# Patient Record
Sex: Female | Born: 1935 | Race: White | Hispanic: No
Health system: Southern US, Community
[De-identification: ages and names within clinical notes are randomized; demographics above are authoritative.]

---

## 2004-09-22 ENCOUNTER — Ambulatory Visit: Payer: Self-pay | Admitting: General Practice

## 2005-09-07 ENCOUNTER — Ambulatory Visit: Payer: Self-pay | Admitting: Internal Medicine

## 2006-01-16 ENCOUNTER — Ambulatory Visit: Payer: Self-pay | Admitting: Internal Medicine

## 2006-02-17 ENCOUNTER — Ambulatory Visit: Payer: Self-pay | Admitting: Internal Medicine

## 2006-04-06 ENCOUNTER — Observation Stay: Payer: Self-pay | Admitting: Internal Medicine

## 2006-05-01 ENCOUNTER — Ambulatory Visit: Payer: Self-pay | Admitting: Unknown Physician Specialty

## 2006-07-13 ENCOUNTER — Ambulatory Visit: Payer: Self-pay | Admitting: Unknown Physician Specialty

## 2006-07-31 ENCOUNTER — Observation Stay: Payer: Self-pay | Admitting: Internal Medicine

## 2006-12-04 ENCOUNTER — Ambulatory Visit: Payer: Self-pay | Admitting: Unknown Physician Specialty

## 2006-12-05 ENCOUNTER — Inpatient Hospital Stay: Payer: Self-pay | Admitting: Internal Medicine

## 2008-03-04 ENCOUNTER — Observation Stay: Payer: Self-pay | Admitting: Internal Medicine

## 2008-05-13 ENCOUNTER — Ambulatory Visit: Payer: Self-pay | Admitting: Internal Medicine

## 2008-11-24 ENCOUNTER — Ambulatory Visit: Payer: Self-pay | Admitting: Otolaryngology

## 2009-02-17 ENCOUNTER — Emergency Department: Payer: Self-pay | Admitting: Emergency Medicine

## 2009-07-07 ENCOUNTER — Inpatient Hospital Stay: Payer: Self-pay | Admitting: Internal Medicine

## 2009-09-23 ENCOUNTER — Observation Stay: Payer: Self-pay | Admitting: Internal Medicine

## 2009-10-29 ENCOUNTER — Observation Stay: Payer: Self-pay | Admitting: Internal Medicine

## 2009-11-19 ENCOUNTER — Ambulatory Visit: Payer: Self-pay | Admitting: Internal Medicine

## 2009-11-20 ENCOUNTER — Ambulatory Visit: Payer: Self-pay | Admitting: Internal Medicine

## 2009-12-10 ENCOUNTER — Ambulatory Visit: Payer: Self-pay | Admitting: Internal Medicine

## 2010-01-10 ENCOUNTER — Ambulatory Visit: Payer: Self-pay | Admitting: Internal Medicine

## 2010-01-25 ENCOUNTER — Inpatient Hospital Stay: Payer: Self-pay | Admitting: Family Medicine

## 2010-02-10 ENCOUNTER — Ambulatory Visit: Payer: Self-pay | Admitting: Internal Medicine

## 2010-02-16 ENCOUNTER — Observation Stay: Payer: Self-pay | Admitting: Internal Medicine

## 2010-03-24 ENCOUNTER — Observation Stay: Payer: Self-pay | Admitting: Internal Medicine

## 2010-05-06 ENCOUNTER — Ambulatory Visit: Payer: Self-pay | Admitting: Otolaryngology

## 2010-05-07 ENCOUNTER — Ambulatory Visit: Payer: Self-pay | Admitting: Otolaryngology

## 2010-05-11 ENCOUNTER — Observation Stay: Payer: Self-pay | Admitting: Internal Medicine

## 2010-05-18 ENCOUNTER — Inpatient Hospital Stay: Payer: Self-pay | Admitting: Unknown Physician Specialty

## 2010-05-21 ENCOUNTER — Encounter: Payer: Self-pay | Admitting: Internal Medicine

## 2010-06-11 ENCOUNTER — Encounter: Payer: Self-pay | Admitting: Internal Medicine

## 2010-07-11 ENCOUNTER — Encounter: Payer: Self-pay | Admitting: Internal Medicine

## 2010-08-11 ENCOUNTER — Encounter: Payer: Self-pay | Admitting: Internal Medicine

## 2011-01-20 ENCOUNTER — Ambulatory Visit: Payer: Self-pay | Admitting: Internal Medicine

## 2011-01-20 LAB — CANCER CENTER HEMOGLOBIN: HGB: 7.9 g/dL — ABNORMAL LOW (ref 12.0–16.0)

## 2011-02-03 LAB — CBC CANCER CENTER
Basophil %: 0.4 %
Eosinophil %: 0.2 %
HCT: 29.4 % — ABNORMAL LOW (ref 35.0–47.0)
HGB: 9.7 g/dL — ABNORMAL LOW (ref 12.0–16.0)
Lymphocyte #: 0.7 x10 3/mm — ABNORMAL LOW (ref 1.0–3.6)
MCV: 94 fL (ref 80–100)
Monocyte %: 12.1 %
Neutrophil #: 3.7 x10 3/mm (ref 1.4–6.5)
Platelet: 116 x10 3/mm — ABNORMAL LOW (ref 150–440)
RBC: 3.14 10*6/uL — ABNORMAL LOW (ref 3.80–5.20)
RDW: 17.1 % — ABNORMAL HIGH (ref 11.5–14.5)
WBC: 5 x10 3/mm (ref 3.6–11.0)

## 2011-02-03 LAB — URINALYSIS, COMPLETE
Ketone: NEGATIVE
Leukocyte Esterase: NEGATIVE
Nitrite: NEGATIVE
Ph: 7 (ref 4.5–8.0)
Protein: NEGATIVE
Specific Gravity: 1.008 (ref 1.003–1.030)

## 2011-02-04 ENCOUNTER — Inpatient Hospital Stay: Payer: Self-pay | Admitting: Internal Medicine

## 2011-02-04 LAB — CBC WITH DIFFERENTIAL/PLATELET
Basophil #: 0.1 10*3/uL (ref 0.0–0.1)
Lymphocyte #: 0.7 10*3/uL — ABNORMAL LOW (ref 1.0–3.6)
MCH: 30.9 pg (ref 26.0–34.0)
MCV: 95 fL (ref 80–100)
Monocyte #: 0.5 10*3/uL (ref 0.0–0.7)
Platelet: 135 10*3/uL — ABNORMAL LOW (ref 150–440)
RBC: 3.24 10*6/uL — ABNORMAL LOW (ref 3.80–5.20)
RDW: 17.6 % — ABNORMAL HIGH (ref 11.5–14.5)
WBC: 5.8 10*3/uL (ref 3.6–11.0)

## 2011-02-04 LAB — COMPREHENSIVE METABOLIC PANEL
Albumin: 3.8 g/dL (ref 3.4–5.0)
Anion Gap: 11 (ref 7–16)
Calcium, Total: 9.3 mg/dL (ref 8.5–10.1)
Co2: 29 mmol/L (ref 21–32)
EGFR (Non-African Amer.): >60
Glucose: 90 mg/dL (ref 65–99)
Osmolality: 287 (ref 275–301)
Potassium: 4.2 mmol/L (ref 3.5–5.1)
SGOT(AST): 27 U/L (ref 15–37)
Sodium: 140 mmol/L (ref 136–145)
Total Protein: 6.8 g/dL (ref 6.4–8.2)

## 2011-02-04 LAB — APTT: Activated PTT: 34.6 secs (ref 23.6–35.9)

## 2011-02-05 LAB — BASIC METABOLIC PANEL
Creatinine: 0.98 mg/dL (ref 0.60–1.30)
EGFR (African American): >60
EGFR (Non-African Amer.): 57 — ABNORMAL LOW
Glucose: 98 mg/dL (ref 65–99)
Potassium: 3.8 mmol/L (ref 3.5–5.1)
Sodium: 138 mmol/L (ref 136–145)

## 2011-02-05 LAB — HEMOGLOBIN: HGB: 8.9 g/dL — ABNORMAL LOW (ref 12.0–16.0)

## 2011-02-06 LAB — HEMOGLOBIN: HGB: 9.1 g/dL — ABNORMAL LOW (ref 12.0–16.0)

## 2011-02-06 LAB — BASIC METABOLIC PANEL
Chloride: 101 mmol/L (ref 98–107)
Co2: 28 mmol/L (ref 21–32)
EGFR (African American): >60
EGFR (Non-African Amer.): >60
Glucose: 106 mg/dL — ABNORMAL HIGH (ref 65–99)
Potassium: 4 mmol/L (ref 3.5–5.1)
Sodium: 140 mmol/L (ref 136–145)

## 2011-02-08 LAB — BASIC METABOLIC PANEL
BUN: 21 mg/dL — ABNORMAL HIGH (ref 7–18)
Calcium, Total: 8.8 mg/dL (ref 8.5–10.1)
Chloride: 103 mmol/L (ref 98–107)
Co2: 28 mmol/L (ref 21–32)
EGFR (Non-African Amer.): 52 — ABNORMAL LOW
Glucose: 111 mg/dL — ABNORMAL HIGH (ref 65–99)
Osmolality: 287 (ref 275–301)
Potassium: 3.9 mmol/L (ref 3.5–5.1)

## 2011-02-11 ENCOUNTER — Ambulatory Visit: Payer: Self-pay | Admitting: Internal Medicine

## 2011-02-17 LAB — CBC CANCER CENTER
Basophil #: 0 x10 3/mm (ref 0.0–0.1)
Eosinophil %: 2.7 %
Lymphocyte #: 1 x10 3/mm (ref 1.0–3.6)
MCH: 31.3 pg (ref 26.0–34.0)
MCHC: 33.8 g/dL (ref 32.0–36.0)
MCV: 93 fL (ref 80–100)
Monocyte #: 0.7 x10 3/mm (ref 0.0–0.7)
Monocyte %: 10.7 %
Neutrophil #: 5 x10 3/mm (ref 1.4–6.5)
Neutrophil %: 71.8 %
Platelet: 217 x10 3/mm (ref 150–440)
RBC: 3.26 10*6/uL — ABNORMAL LOW (ref 3.80–5.20)

## 2011-02-17 LAB — IRON AND TIBC
Iron Bind.Cap.(Total): 451 ug/dL — ABNORMAL HIGH (ref 250–450)
Iron Saturation: 10 %
Iron: 43 ug/dL — ABNORMAL LOW (ref 50–170)

## 2011-02-17 LAB — FERRITIN: Ferritin (ARMC): 92 ng/mL (ref 8–388)

## 2011-02-21 ENCOUNTER — Observation Stay: Payer: Self-pay | Admitting: Internal Medicine

## 2011-02-21 LAB — COMPREHENSIVE METABOLIC PANEL
Albumin: 3.7 g/dL (ref 3.4–5.0)
Alkaline Phosphatase: 139 U/L — ABNORMAL HIGH (ref 50–136)
Anion Gap: 11 (ref 7–16)
BUN: 41 mg/dL — ABNORMAL HIGH (ref 7–18)
Bilirubin,Total: 0.6 mg/dL (ref 0.2–1.0)
Calcium, Total: 9.3 mg/dL (ref 8.5–10.1)
Chloride: 89 mmol/L — ABNORMAL LOW (ref 98–107)
Co2: 33 mmol/L — ABNORMAL HIGH (ref 21–32)
Creatinine: 0.88 mg/dL (ref 0.60–1.30)
EGFR (African American): >60
Glucose: 95 mg/dL (ref 65–99)
Osmolality: 276 (ref 275–301)
Potassium: 2.5 mmol/L — CL (ref 3.5–5.1)
Sodium: 133 mmol/L — ABNORMAL LOW (ref 136–145)
Total Protein: 7.7 g/dL (ref 6.4–8.2)

## 2011-02-21 LAB — CBC
MCH: 31 pg (ref 26.0–34.0)
MCHC: 33.7 g/dL (ref 32.0–36.0)
MCV: 92 fL (ref 80–100)
RBC: 3.44 10*6/uL — ABNORMAL LOW (ref 3.80–5.20)
RDW: 17 % — ABNORMAL HIGH (ref 11.5–14.5)
WBC: 6.4 10*3/uL (ref 3.6–11.0)

## 2011-02-21 LAB — HEMOGLOBIN: HGB: 10.2 g/dL — ABNORMAL LOW (ref 12.0–16.0)

## 2011-02-21 LAB — PROTIME-INR
INR: 0.9
Prothrombin Time: 12.9 secs (ref 11.5–14.7)

## 2011-02-21 LAB — CK TOTAL AND CKMB (NOT AT ARMC): CK-MB: 1.5 ng/mL (ref 0.5–3.6)

## 2011-02-21 LAB — TROPONIN I: Troponin-I: 0.1 ng/mL — ABNORMAL HIGH

## 2011-02-22 LAB — CBC WITH DIFFERENTIAL/PLATELET
Basophil #: 0 10*3/uL (ref 0.0–0.1)
Basophil %: 0.4 %
Eosinophil #: 0.3 10*3/uL (ref 0.0–0.7)
HCT: 28.4 % — ABNORMAL LOW (ref 35.0–47.0)
Lymphocyte #: 1.1 10*3/uL (ref 1.0–3.6)
MCH: 30.8 pg (ref 26.0–34.0)
MCHC: 33.5 g/dL (ref 32.0–36.0)
MCV: 92 fL (ref 80–100)
Monocyte #: 0.7 10*3/uL (ref 0.0–0.7)
Monocyte %: 10.4 %
Neutrophil #: 4.4 10*3/uL (ref 1.4–6.5)
RBC: 3.09 10*6/uL — ABNORMAL LOW (ref 3.80–5.20)
RDW: 16.4 % — ABNORMAL HIGH (ref 11.5–14.5)
WBC: 6.5 10*3/uL (ref 3.6–11.0)

## 2011-02-22 LAB — BASIC METABOLIC PANEL
Anion Gap: 7 (ref 7–16)
BUN: 22 mg/dL — ABNORMAL HIGH (ref 7–18)
Calcium, Total: 8.4 mg/dL — ABNORMAL LOW (ref 8.5–10.1)
Chloride: 98 mmol/L (ref 98–107)
Co2: 29 mmol/L (ref 21–32)
Creatinine: 0.86 mg/dL (ref 0.60–1.30)
EGFR (Non-African Amer.): >60
Glucose: 93 mg/dL (ref 65–99)
Potassium: 5.3 mmol/L — ABNORMAL HIGH (ref 3.5–5.1)
Sodium: 134 mmol/L — ABNORMAL LOW (ref 136–145)

## 2011-02-25 ENCOUNTER — Ambulatory Visit: Payer: Self-pay | Admitting: Internal Medicine

## 2011-03-04 ENCOUNTER — Ambulatory Visit: Payer: Self-pay | Admitting: Unknown Physician Specialty

## 2011-03-08 ENCOUNTER — Inpatient Hospital Stay: Payer: Self-pay | Admitting: Unknown Physician Specialty

## 2011-03-08 DIAGNOSIS — I4891 Unspecified atrial fibrillation: Secondary | ICD-10-CM

## 2011-03-08 LAB — BASIC METABOLIC PANEL
Anion Gap: 11 (ref 7–16)
BUN: 19 mg/dL — ABNORMAL HIGH (ref 7–18)
Chloride: 106 mmol/L (ref 98–107)
EGFR (African American): >60
EGFR (Non-African Amer.): 55 — ABNORMAL LOW
Osmolality: 296 (ref 275–301)

## 2011-03-08 LAB — URINALYSIS, COMPLETE
Bilirubin,UR: NEGATIVE
Glucose,UR: NEGATIVE mg/dL (ref 0–75)
Ketone: NEGATIVE
Leukocyte Esterase: NEGATIVE
Nitrite: NEGATIVE
Ph: 5 (ref 4.5–8.0)
RBC,UR: NONE SEEN /HPF (ref 0–5)
Squamous Epithelial: 1
WBC UR: 1 /HPF (ref 0–5)

## 2011-03-08 LAB — CBC
HCT: 23.5 % — ABNORMAL LOW (ref 35.0–47.0)
HGB: 7.6 g/dL — ABNORMAL LOW (ref 12.0–16.0)
MCHC: 32.3 g/dL (ref 32.0–36.0)
MCV: 95 fL (ref 80–100)
RBC: 2.47 10*6/uL — ABNORMAL LOW (ref 3.80–5.20)
RDW: 18 % — ABNORMAL HIGH (ref 11.5–14.5)
WBC: 4.6 10*3/uL (ref 3.6–11.0)

## 2011-03-09 LAB — BASIC METABOLIC PANEL
Anion Gap: 12 (ref 7–16)
BUN: 19 mg/dL — ABNORMAL HIGH (ref 7–18)
Co2: 24 mmol/L (ref 21–32)
Creatinine: 1.07 mg/dL (ref 0.60–1.30)
EGFR (Non-African Amer.): 51 — ABNORMAL LOW
Glucose: 122 mg/dL — ABNORMAL HIGH (ref 65–99)
Osmolality: 283 (ref 275–301)
Sodium: 140 mmol/L (ref 136–145)

## 2011-03-10 LAB — CBC WITH DIFFERENTIAL/PLATELET
Basophil %: 0.8 %
Eosinophil #: 0.4 10*3/uL (ref 0.0–0.7)
Eosinophil %: 7.9 %
HGB: 9.2 g/dL — ABNORMAL LOW (ref 12.0–16.0)
Lymphocyte #: 1 10*3/uL (ref 1.0–3.6)
MCH: 31.1 pg (ref 26.0–34.0)
MCHC: 32.7 g/dL (ref 32.0–36.0)
MCV: 95 fL (ref 80–100)
Monocyte #: 0.6 10*3/uL (ref 0.0–0.7)
Neutrophil #: 2.6 10*3/uL (ref 1.4–6.5)
Platelet: 140 10*3/uL — ABNORMAL LOW (ref 150–440)
RBC: 2.97 10*6/uL — ABNORMAL LOW (ref 3.80–5.20)

## 2011-03-10 LAB — BASIC METABOLIC PANEL
Co2: 24 mmol/L (ref 21–32)
Creatinine: 0.99 mg/dL (ref 0.60–1.30)
EGFR (African American): >60
EGFR (Non-African Amer.): 56 — ABNORMAL LOW
Glucose: 90 mg/dL (ref 65–99)
Potassium: 4.5 mmol/L (ref 3.5–5.1)
Sodium: 140 mmol/L (ref 136–145)

## 2011-03-10 LAB — MAGNESIUM: Magnesium: 2 mg/dL

## 2011-03-11 ENCOUNTER — Ambulatory Visit: Payer: Self-pay | Admitting: Internal Medicine

## 2011-03-11 LAB — BASIC METABOLIC PANEL
Anion Gap: 7 (ref 7–16)
BUN: 15 mg/dL (ref 7–18)
Calcium, Total: 8.5 mg/dL (ref 8.5–10.1)
Co2: 28 mmol/L (ref 21–32)
EGFR (African American): >60
EGFR (Non-African Amer.): 59 — ABNORMAL LOW
Glucose: 110 mg/dL — ABNORMAL HIGH (ref 65–99)
Osmolality: 283 (ref 275–301)
Sodium: 141 mmol/L (ref 136–145)

## 2011-03-11 LAB — URINALYSIS, COMPLETE
Bilirubin,UR: NEGATIVE
Ketone: NEGATIVE
Leukocyte Esterase: NEGATIVE
Nitrite: NEGATIVE
Ph: 5 (ref 4.5–8.0)
Protein: NEGATIVE
RBC,UR: <1 /HPF (ref 0–5)
Squamous Epithelial: <1
WBC UR: 1 /HPF (ref 0–5)

## 2011-03-14 LAB — PATHOLOGY REPORT

## 2011-05-09 ENCOUNTER — Inpatient Hospital Stay: Payer: Self-pay | Admitting: Internal Medicine

## 2011-05-09 LAB — CBC WITH DIFFERENTIAL/PLATELET
Basophil %: 1.2 %
HCT: 22.9 % — ABNORMAL LOW (ref 35.0–47.0)
Lymphocyte #: 1.3 10*3/uL (ref 1.0–3.6)
MCV: 99 fL (ref 80–100)
Monocyte #: 0.5 x10 3/mm (ref 0.2–0.9)
Monocyte %: 10.8 %
Neutrophil %: 53.8 %
Platelet: 134 10*3/uL — ABNORMAL LOW (ref 150–440)
RBC: 2.32 10*6/uL — ABNORMAL LOW (ref 3.80–5.20)
RDW: 15.2 % — ABNORMAL HIGH (ref 11.5–14.5)

## 2011-05-09 LAB — COMPREHENSIVE METABOLIC PANEL
Alkaline Phosphatase: 134 U/L (ref 50–136)
Anion Gap: 10 (ref 7–16)
Bilirubin,Total: 0.4 mg/dL (ref 0.2–1.0)
Co2: 24 mmol/L (ref 21–32)
EGFR (Non-African Amer.): 49 — ABNORMAL LOW
Potassium: 4.3 mmol/L (ref 3.5–5.1)
SGPT (ALT): 16 U/L
Sodium: 138 mmol/L (ref 136–145)

## 2011-05-10 LAB — URINALYSIS, COMPLETE
Bacteria: NONE SEEN
Leukocyte Esterase: NEGATIVE
Nitrite: NEGATIVE
Ph: 6 (ref 4.5–8.0)
Protein: NEGATIVE
RBC,UR: <1 /HPF (ref 0–5)
Specific Gravity: 1.009 (ref 1.003–1.030)
Squamous Epithelial: <1
WBC UR: <1 /HPF (ref 0–5)

## 2011-05-10 LAB — CBC WITH DIFFERENTIAL/PLATELET
Basophil #: 0.1 10*3/uL (ref 0.0–0.1)
Basophil %: 2.1 %
HGB: 11.1 g/dL — ABNORMAL LOW (ref 12.0–16.0)
Lymphocyte %: 31.2 %
MCH: 31.9 pg (ref 26.0–34.0)
MCHC: 32.9 g/dL (ref 32.0–36.0)
MCV: 97 fL (ref 80–100)
Monocyte %: 11.7 %
Neutrophil %: 52.3 %

## 2011-05-11 LAB — BASIC METABOLIC PANEL
Calcium, Total: 8.2 mg/dL — ABNORMAL LOW (ref 8.5–10.1)
Chloride: 109 mmol/L — ABNORMAL HIGH (ref 98–107)
Co2: 25 mmol/L (ref 21–32)
Glucose: 77 mg/dL (ref 65–99)
Osmolality: 282 (ref 275–301)
Potassium: 3.7 mmol/L (ref 3.5–5.1)
Sodium: 141 mmol/L (ref 136–145)

## 2011-05-11 LAB — CBC WITH DIFFERENTIAL/PLATELET
Basophil #: 0 10*3/uL (ref 0.0–0.1)
Basophil %: 1.3 %
Eosinophil #: 0.1 10*3/uL (ref 0.0–0.7)
Eosinophil %: 3.1 %
HCT: 26.7 % — ABNORMAL LOW (ref 35.0–47.0)
HGB: 8.8 g/dL — ABNORMAL LOW (ref 12.0–16.0)
Lymphocyte %: 32 %
MCV: 95 fL (ref 80–100)
Monocyte #: 0.5 x10 3/mm (ref 0.2–0.9)
Monocyte %: 13.8 %
Neutrophil #: 1.8 10*3/uL (ref 1.4–6.5)
RBC: 2.81 10*6/uL — ABNORMAL LOW (ref 3.80–5.20)

## 2011-05-12 LAB — CBC WITH DIFFERENTIAL/PLATELET
Basophil %: 1.1 %
Eosinophil #: 0.1 10*3/uL (ref 0.0–0.7)
Eosinophil %: 2.7 %
HCT: 26.7 % — ABNORMAL LOW (ref 35.0–47.0)
Lymphocyte %: 25.8 %
MCHC: 33.8 g/dL (ref 32.0–36.0)
MCV: 95 fL (ref 80–100)
Monocyte #: 0.5 x10 3/mm (ref 0.2–0.9)
Monocyte %: 13.9 %
Neutrophil #: 2 10*3/uL (ref 1.4–6.5)
Neutrophil %: 56.5 %
Platelet: 120 10*3/uL — ABNORMAL LOW (ref 150–440)
RBC: 2.82 10*6/uL — ABNORMAL LOW (ref 3.80–5.20)
RDW: 15.5 % — ABNORMAL HIGH (ref 11.5–14.5)
WBC: 3.5 10*3/uL — ABNORMAL LOW (ref 3.6–11.0)

## 2011-05-12 LAB — BASIC METABOLIC PANEL
BUN: 11 mg/dL (ref 7–18)
Calcium, Total: 8.3 mg/dL — ABNORMAL LOW (ref 8.5–10.1)
Chloride: 107 mmol/L (ref 98–107)
Creatinine: 0.78 mg/dL (ref 0.60–1.30)
EGFR (African American): >60
EGFR (Non-African Amer.): >60
Glucose: 71 mg/dL (ref 65–99)
Potassium: 3.1 mmol/L — ABNORMAL LOW (ref 3.5–5.1)
Sodium: 141 mmol/L (ref 136–145)

## 2011-05-13 LAB — BASIC METABOLIC PANEL
Anion Gap: 6 — ABNORMAL LOW (ref 7–16)
BUN: 12 mg/dL (ref 7–18)
Chloride: 107 mmol/L (ref 98–107)
Co2: 27 mmol/L (ref 21–32)
Creatinine: 0.9 mg/dL (ref 0.60–1.30)
EGFR (African American): >60
Potassium: 4.2 mmol/L (ref 3.5–5.1)
Sodium: 140 mmol/L (ref 136–145)

## 2011-05-13 LAB — HEMOGLOBIN: HGB: 8.8 g/dL — ABNORMAL LOW (ref 12.0–16.0)

## 2011-07-15 ENCOUNTER — Ambulatory Visit: Payer: Self-pay | Admitting: Internal Medicine

## 2011-07-15 LAB — CBC CANCER CENTER
HCT: 21.1 % — ABNORMAL LOW (ref 35.0–47.0)
Lymphocytes: 15 %
MCHC: 30.9 g/dL — ABNORMAL LOW (ref 32.0–36.0)
Monocytes: 11 %
Platelet: 155 x10 3/mm (ref 150–440)
RDW: 15.1 % — ABNORMAL HIGH (ref 11.5–14.5)
Segmented Neutrophils: 72 %
WBC: 4.5 x10 3/mm (ref 3.6–11.0)

## 2011-07-15 LAB — FOLATE: Folic Acid: 28.8 ng/mL (ref 3.1–100.0)

## 2011-07-15 LAB — LACTATE DEHYDROGENASE: LDH: 196 U/L (ref 84–246)

## 2011-07-15 LAB — CANCER CENTER HEMOGLOBIN: HGB: 7.7 g/dL — ABNORMAL LOW (ref 12.0–16.0)

## 2011-07-15 LAB — FERRITIN: Ferritin (ARMC): 24 ng/mL (ref 8–388)

## 2011-07-18 LAB — PROT IMMUNOELECTROPHORES(ARMC)

## 2011-07-20 LAB — CANCER CENTER HEMOGLOBIN: HGB: 7.4 g/dL — ABNORMAL LOW (ref 12.0–16.0)

## 2011-08-03 LAB — CANCER CENTER HEMOGLOBIN: HGB: 8.8 g/dL — ABNORMAL LOW (ref 12.0–16.0)

## 2011-08-11 ENCOUNTER — Ambulatory Visit: Payer: Self-pay | Admitting: Internal Medicine

## 2011-08-17 LAB — CANCER CENTER HEMOGLOBIN: HGB: 6.2 g/dL — ABNORMAL LOW (ref 12.0–16.0)

## 2011-08-28 ENCOUNTER — Emergency Department: Payer: Self-pay | Admitting: Internal Medicine

## 2011-09-07 LAB — CBC CANCER CENTER
Basophil #: 0 x10 3/mm (ref 0.0–0.1)
Basophil %: 0.9 %
Eosinophil #: 0.1 x10 3/mm (ref 0.0–0.7)
HCT: 19 % — ABNORMAL LOW (ref 35.0–47.0)
Lymphocyte %: 15 %
MCH: 27.5 pg (ref 26.0–34.0)
MCHC: 31.2 g/dL — ABNORMAL LOW (ref 32.0–36.0)
Monocyte #: 0.9 x10 3/mm (ref 0.2–0.9)
Neutrophil #: 3.7 x10 3/mm (ref 1.4–6.5)
Neutrophil %: 65.4 %
Platelet: 223 x10 3/mm (ref 150–440)
RBC: 2.16 10*6/uL — ABNORMAL LOW (ref 3.80–5.20)
RDW: 17.1 % — ABNORMAL HIGH (ref 11.5–14.5)
WBC: 5.6 x10 3/mm (ref 3.6–11.0)

## 2011-09-07 LAB — CANCER CENTER HEMOGLOBIN: HGB: 8.5 g/dL — ABNORMAL LOW (ref 12.0–16.0)

## 2011-09-11 ENCOUNTER — Ambulatory Visit: Payer: Self-pay | Admitting: Internal Medicine

## 2011-09-14 LAB — CANCER CENTER HEMOGLOBIN: HGB: 8.1 g/dL — ABNORMAL LOW (ref 12.0–16.0)

## 2011-09-21 LAB — CANCER CENTER HEMOGLOBIN: HGB: 7.5 g/dL — ABNORMAL LOW (ref 12.0–16.0)

## 2011-09-28 LAB — IRON AND TIBC
Iron Bind.Cap.(Total): 578 ug/dL — ABNORMAL HIGH (ref 250–450)
Iron: 32 ug/dL — ABNORMAL LOW (ref 50–170)

## 2011-09-28 LAB — CANCER CENTER HEMOGLOBIN: HGB: 7.4 g/dL — ABNORMAL LOW (ref 12.0–16.0)

## 2011-10-08 ENCOUNTER — Inpatient Hospital Stay: Payer: Self-pay | Admitting: Surgery

## 2011-10-09 LAB — CBC WITH DIFFERENTIAL/PLATELET
Basophil %: 1.3 %
Eosinophil #: 0.2 10*3/uL (ref 0.0–0.7)
HCT: 23.7 % — ABNORMAL LOW (ref 35.0–47.0)
HGB: 8.2 g/dL — ABNORMAL LOW (ref 12.0–16.0)
Lymphocyte #: 0.7 10*3/uL — ABNORMAL LOW (ref 1.0–3.6)
MCH: 30.7 pg (ref 26.0–34.0)
MCHC: 34.6 g/dL (ref 32.0–36.0)
MCV: 89 fL (ref 80–100)
Monocyte #: 0.4 x10 3/mm (ref 0.2–0.9)
Neutrophil #: 1.9 10*3/uL (ref 1.4–6.5)
Platelet: 128 10*3/uL — ABNORMAL LOW (ref 150–440)
WBC: 3.2 10*3/uL — ABNORMAL LOW (ref 3.6–11.0)

## 2011-10-09 LAB — BASIC METABOLIC PANEL
Anion Gap: 8 (ref 7–16)
Calcium, Total: 8.8 mg/dL (ref 8.5–10.1)
Chloride: 99 mmol/L (ref 98–107)
Co2: 32 mmol/L (ref 21–32)
Glucose: 75 mg/dL (ref 65–99)
Osmolality: 284 (ref 275–301)
Sodium: 139 mmol/L (ref 136–145)

## 2011-10-09 LAB — POTASSIUM: Potassium: 3.8 mmol/L (ref 3.5–5.1)

## 2011-10-10 LAB — CBC WITH DIFFERENTIAL/PLATELET
Basophil #: 0 10*3/uL (ref 0.0–0.1)
Eosinophil #: 0.1 10*3/uL (ref 0.0–0.7)
Eosinophil %: 4.2 %
HCT: 22 % — ABNORMAL LOW (ref 35.0–47.0)
HGB: 7.3 g/dL — ABNORMAL LOW (ref 12.0–16.0)
Lymphocyte #: 0.9 10*3/uL — ABNORMAL LOW (ref 1.0–3.6)
Lymphocyte %: 26.3 %
MCHC: 33 g/dL (ref 32.0–36.0)
Monocyte #: 0.5 x10 3/mm (ref 0.2–0.9)
Monocyte %: 14.7 %
Neutrophil %: 53.8 %
Platelet: 118 10*3/uL — ABNORMAL LOW (ref 150–440)
RDW: 20.8 % — ABNORMAL HIGH (ref 11.5–14.5)
WBC: 3.4 10*3/uL — ABNORMAL LOW (ref 3.6–11.0)

## 2011-10-10 LAB — BASIC METABOLIC PANEL
Calcium, Total: 8.3 mg/dL — ABNORMAL LOW (ref 8.5–10.1)
Chloride: 101 mmol/L (ref 98–107)
Co2: 30 mmol/L (ref 21–32)
EGFR (African American): >60
EGFR (Non-African Amer.): 54 — ABNORMAL LOW
Glucose: 92 mg/dL (ref 65–99)
Osmolality: 286 (ref 275–301)
Sodium: 140 mmol/L (ref 136–145)

## 2011-10-11 ENCOUNTER — Ambulatory Visit: Payer: Self-pay | Admitting: Internal Medicine

## 2011-11-11 ENCOUNTER — Ambulatory Visit: Payer: Self-pay | Admitting: Internal Medicine

## 2011-11-16 LAB — CBC CANCER CENTER
Basophil %: 1.4 %
Eosinophil %: 2.7 %
HCT: 30.2 % — ABNORMAL LOW (ref 35.0–47.0)
HGB: 9.6 g/dL — ABNORMAL LOW (ref 12.0–16.0)
Lymphocyte #: 0.8 x10 3/mm — ABNORMAL LOW (ref 1.0–3.6)
MCHC: 31.9 g/dL — ABNORMAL LOW (ref 32.0–36.0)
Monocyte #: 0.4 x10 3/mm (ref 0.2–0.9)
Monocyte %: 12 %
Neutrophil #: 2.1 x10 3/mm (ref 1.4–6.5)

## 2011-11-16 LAB — IRON AND TIBC
Iron Saturation: 29 %
Iron: 126 ug/dL (ref 50–170)

## 2011-11-16 LAB — FERRITIN: Ferritin (ARMC): 133 ng/mL (ref 8–388)

## 2011-11-23 LAB — CBC CANCER CENTER
HCT: 28 % — ABNORMAL LOW (ref 35.0–47.0)
Lymphocyte %: 22.1 %
MCH: 29.9 pg (ref 26.0–34.0)
MCHC: 31.6 g/dL — ABNORMAL LOW (ref 32.0–36.0)
Monocyte %: 12.9 %
Platelet: 96 x10 3/mm — ABNORMAL LOW (ref 150–440)
RBC: 2.96 10*6/uL — ABNORMAL LOW (ref 3.80–5.20)
RDW: 19.6 % — ABNORMAL HIGH (ref 11.5–14.5)
WBC: 2.7 x10 3/mm — ABNORMAL LOW (ref 3.6–11.0)

## 2011-12-07 LAB — CBC CANCER CENTER
Basophil: 1 %
HCT: 23.2 % — ABNORMAL LOW (ref 35.0–47.0)
HGB: 7.6 g/dL — ABNORMAL LOW (ref 12.0–16.0)
Lymphocytes: 24 %
MCH: 30.7 pg (ref 26.0–34.0)
MCHC: 32.7 g/dL (ref 32.0–36.0)

## 2011-12-07 LAB — RETICULOCYTES
Absolute Retic Count: 0.0575 10*6/uL (ref 0.023–0.096)
Reticulocyte: 2.33 % — ABNORMAL HIGH (ref 0.5–2.2)

## 2011-12-11 ENCOUNTER — Ambulatory Visit: Payer: Self-pay | Admitting: Internal Medicine

## 2011-12-12 ENCOUNTER — Ambulatory Visit: Payer: Self-pay | Admitting: Gastroenterology

## 2011-12-14 LAB — CBC CANCER CENTER
Basophil #: 0 x10 3/mm (ref 0.0–0.1)
Basophil %: 0.6 %
Eosinophil #: 0 x10 3/mm (ref 0.0–0.7)
HCT: 27.3 % — ABNORMAL LOW (ref 35.0–47.0)
HGB: 9.1 g/dL — ABNORMAL LOW (ref 12.0–16.0)
Lymphocyte #: 1 x10 3/mm (ref 1.0–3.6)
MCH: 30.2 pg (ref 26.0–34.0)
MCHC: 33.5 g/dL (ref 32.0–36.0)
MCV: 90 fL (ref 80–100)
Monocyte #: 0.4 x10 3/mm (ref 0.2–0.9)
Neutrophil #: 1.9 x10 3/mm (ref 1.4–6.5)
Neutrophil %: 57.8 %
RBC: 3.02 10*6/uL — ABNORMAL LOW (ref 3.80–5.20)

## 2011-12-28 LAB — CBC CANCER CENTER
Basophil #: 0 x10 3/mm (ref 0.0–0.1)
Basophil %: 0.8 %
Eosinophil %: 2 %
HCT: 25.1 % — ABNORMAL LOW (ref 35.0–47.0)
Lymphocyte #: 1 x10 3/mm (ref 1.0–3.6)
MCHC: 32.9 g/dL (ref 32.0–36.0)
Monocyte #: 0.5 x10 3/mm (ref 0.2–0.9)
Monocyte %: 12.2 %
Neutrophil #: 2.5 x10 3/mm (ref 1.4–6.5)
Neutrophil %: 60.8 %
Platelet: 125 x10 3/mm — ABNORMAL LOW (ref 150–440)
RDW: 20.2 % — ABNORMAL HIGH (ref 11.5–14.5)

## 2012-01-11 ENCOUNTER — Ambulatory Visit: Payer: Self-pay | Admitting: Internal Medicine

## 2012-01-16 ENCOUNTER — Inpatient Hospital Stay: Payer: Self-pay | Admitting: Internal Medicine

## 2012-01-16 LAB — CBC CANCER CENTER
Basophil %: 1.2 %
Eosinophil #: 0.1 x10 3/mm (ref 0.0–0.7)
Eosinophil %: 1.2 %
Lymphocyte #: 0.9 x10 3/mm — ABNORMAL LOW (ref 1.0–3.6)
Lymphocyte %: 20.2 %
MCH: 29.1 pg (ref 26.0–34.0)
MCV: 90 fL (ref 80–100)
Monocyte #: 0.6 x10 3/mm (ref 0.2–0.9)
Monocyte %: 13.5 %
Neutrophil #: 2.7 x10 3/mm (ref 1.4–6.5)
Platelet: 201 x10 3/mm (ref 150–440)
RBC: 1.75 10*6/uL — ABNORMAL LOW (ref 3.80–5.20)
RDW: 17 % — ABNORMAL HIGH (ref 11.5–14.5)
WBC: 4.2 x10 3/mm (ref 3.6–11.0)

## 2012-01-16 LAB — IRON AND TIBC
Iron Bind.Cap.(Total): 451 ug/dL — ABNORMAL HIGH (ref 250–450)
Unbound Iron-Bind.Cap.: 435 ug/dL

## 2012-01-16 LAB — FERRITIN: Ferritin (ARMC): 23 ng/mL (ref 8–388)

## 2012-01-17 LAB — CBC WITH DIFFERENTIAL/PLATELET
Basophil #: 0.1 10*3/uL (ref 0.0–0.1)
Eosinophil #: 0.1 10*3/uL (ref 0.0–0.7)
Eosinophil %: 2.5 %
HCT: 24.3 % — ABNORMAL LOW (ref 35.0–47.0)
HGB: 8.1 g/dL — ABNORMAL LOW (ref 12.0–16.0)
Lymphocyte #: 0.8 10*3/uL — ABNORMAL LOW (ref 1.0–3.6)
MCHC: 33.5 g/dL (ref 32.0–36.0)
Monocyte #: 0.6 x10 3/mm (ref 0.2–0.9)
Monocyte %: 13.3 %
Neutrophil #: 2.6 10*3/uL (ref 1.4–6.5)
Neutrophil %: 63.2 %
RDW: 15.6 % — ABNORMAL HIGH (ref 11.5–14.5)
WBC: 4.2 10*3/uL (ref 3.6–11.0)

## 2012-01-17 LAB — BASIC METABOLIC PANEL
Chloride: 101 mmol/L (ref 98–107)
EGFR (African American): 43 — ABNORMAL LOW
EGFR (Non-African Amer.): 37 — ABNORMAL LOW
Osmolality: 292 (ref 275–301)

## 2012-01-18 LAB — CBC WITH DIFFERENTIAL/PLATELET
Basophil #: 0 10*3/uL (ref 0.0–0.1)
Basophil %: 1 %
HCT: 22.4 % — ABNORMAL LOW (ref 35.0–47.0)
Lymphocyte %: 21 %
MCH: 28.5 pg (ref 26.0–34.0)
MCV: 90 fL (ref 80–100)
Monocyte #: 0.5 x10 3/mm (ref 0.2–0.9)
Monocyte %: 13.6 %
Neutrophil #: 2.3 10*3/uL (ref 1.4–6.5)
Neutrophil %: 60 %
RDW: 15.2 % — ABNORMAL HIGH (ref 11.5–14.5)
WBC: 3.8 10*3/uL (ref 3.6–11.0)

## 2012-01-18 LAB — MAGNESIUM: Magnesium: 2.6 mg/dL — ABNORMAL HIGH

## 2012-01-24 LAB — CBC CANCER CENTER
HCT: 24.3 % — ABNORMAL LOW (ref 35.0–47.0)
Lymphocyte #: 0.9 x10 3/mm — ABNORMAL LOW (ref 1.0–3.6)
Lymphocyte %: 21.6 %
MCH: 30.5 pg (ref 26.0–34.0)
MCV: 92 fL (ref 80–100)
Monocyte #: 0.5 x10 3/mm (ref 0.2–0.9)
Monocyte %: 12.7 %
Neutrophil #: 2.4 x10 3/mm (ref 1.4–6.5)
Neutrophil %: 60.5 %
Platelet: 106 x10 3/mm — ABNORMAL LOW (ref 150–440)

## 2012-01-24 LAB — POTASSIUM: Potassium: 3.4 mmol/L — ABNORMAL LOW (ref 3.5–5.1)

## 2012-01-31 LAB — CBC CANCER CENTER
Basophil #: 0 x10 3/mm (ref 0.0–0.1)
Basophil %: 1.2 %
Eosinophil #: 0.1 x10 3/mm (ref 0.0–0.7)
Eosinophil %: 3.3 %
HGB: 6 g/dL — ABNORMAL LOW (ref 12.0–16.0)
Lymphocyte %: 24.4 %
MCHC: 32.8 g/dL (ref 32.0–36.0)
Monocyte #: 0.4 x10 3/mm (ref 0.2–0.9)
Monocyte %: 11.6 %
Platelet: 151 x10 3/mm (ref 150–440)
RDW: 21.4 % — ABNORMAL HIGH (ref 11.5–14.5)

## 2012-02-07 LAB — CBC CANCER CENTER
Basophil #: 0 x10 3/mm (ref 0.0–0.1)
Basophil %: 1.1 %
Eosinophil %: 6 %
Lymphocyte #: 0.7 x10 3/mm — ABNORMAL LOW (ref 1.0–3.6)
Lymphocyte %: 21.2 %
MCH: 31.1 pg (ref 26.0–34.0)
MCHC: 32.3 g/dL (ref 32.0–36.0)
MCV: 96 fL (ref 80–100)
Monocyte #: 0.4 x10 3/mm (ref 0.2–0.9)
Neutrophil #: 2.1 x10 3/mm (ref 1.4–6.5)
Platelet: 106 x10 3/mm — ABNORMAL LOW (ref 150–440)
RBC: 3.21 10*6/uL — ABNORMAL LOW (ref 3.80–5.20)
RDW: 20.1 % — ABNORMAL HIGH (ref 11.5–14.5)
WBC: 3.4 x10 3/mm — ABNORMAL LOW (ref 3.6–11.0)

## 2012-02-11 ENCOUNTER — Ambulatory Visit: Payer: Self-pay | Admitting: Internal Medicine

## 2012-02-14 LAB — CBC CANCER CENTER
Basophil #: 0.1 x10 3/mm (ref 0.0–0.1)
Basophil %: 2.6 %
Eosinophil #: 0.1 x10 3/mm (ref 0.0–0.7)
Eosinophil %: 4.2 %
HCT: 31.3 % — ABNORMAL LOW (ref 35.0–47.0)
HGB: 10.3 g/dL — ABNORMAL LOW (ref 12.0–16.0)
Lymphocyte #: 0.7 x10 3/mm — ABNORMAL LOW (ref 1.0–3.6)
Lymphocyte %: 23.3 %
MCH: 31.8 pg (ref 26.0–34.0)
Monocyte #: 0.3 x10 3/mm (ref 0.2–0.9)
Monocyte %: 11.2 %
Neutrophil #: 1.7 x10 3/mm (ref 1.4–6.5)
Platelet: 96 x10 3/mm — ABNORMAL LOW (ref 150–440)
RBC: 3.24 10*6/uL — ABNORMAL LOW (ref 3.80–5.20)
WBC: 2.9 x10 3/mm — ABNORMAL LOW (ref 3.6–11.0)

## 2012-02-21 LAB — IRON AND TIBC
Iron Bind.Cap.(Total): 404 ug/dL (ref 250–450)
Iron Saturation: 15 %

## 2012-02-21 LAB — FERRITIN: Ferritin (ARMC): 100 ng/mL (ref 8–388)

## 2012-02-21 LAB — CBC CANCER CENTER
Basophil #: 0 x10 3/mm (ref 0.0–0.1)
Basophil %: 1.1 %
Eosinophil #: 0.2 x10 3/mm (ref 0.0–0.7)
Eosinophil %: 4.8 %
HCT: 31.4 % — ABNORMAL LOW (ref 35.0–47.0)
HGB: 10.6 g/dL — ABNORMAL LOW (ref 12.0–16.0)
Lymphocyte %: 23.2 %
Monocyte #: 0.4 x10 3/mm (ref 0.2–0.9)
Monocyte %: 13.2 %
Neutrophil #: 1.9 x10 3/mm (ref 1.4–6.5)
Platelet: 124 x10 3/mm — ABNORMAL LOW (ref 150–440)
RDW: 18.2 % — ABNORMAL HIGH (ref 11.5–14.5)
WBC: 3.3 x10 3/mm — ABNORMAL LOW (ref 3.6–11.0)

## 2012-02-21 LAB — POTASSIUM: Potassium: 3.5 mmol/L (ref 3.5–5.1)

## 2012-02-21 LAB — CREATININE, SERUM
EGFR (African American): 51 — ABNORMAL LOW
EGFR (Non-African Amer.): 44 — ABNORMAL LOW

## 2012-02-21 LAB — MAGNESIUM: Magnesium: 2.5 mg/dL — ABNORMAL HIGH

## 2012-02-28 LAB — CBC CANCER CENTER
Basophil #: 0 x10 3/mm (ref 0.0–0.1)
Basophil %: 1 %
HCT: 25.9 % — ABNORMAL LOW (ref 35.0–47.0)
Lymphocyte #: 0.8 x10 3/mm — ABNORMAL LOW (ref 1.0–3.6)
Lymphocyte %: 20.3 %
MCHC: 32.9 g/dL (ref 32.0–36.0)
MCV: 96 fL (ref 80–100)
Monocyte #: 0.4 x10 3/mm (ref 0.2–0.9)
Neutrophil #: 2.4 x10 3/mm (ref 1.4–6.5)
Platelet: 123 x10 3/mm — ABNORMAL LOW (ref 150–440)
RBC: 2.72 10*6/uL — ABNORMAL LOW (ref 3.80–5.20)
WBC: 3.7 x10 3/mm (ref 3.6–11.0)

## 2012-03-10 ENCOUNTER — Ambulatory Visit: Payer: Self-pay | Admitting: Internal Medicine

## 2012-03-20 LAB — CBC CANCER CENTER
Basophil %: 1.2 %
HCT: 21.2 % — ABNORMAL LOW (ref 35.0–47.0)
Lymphocyte #: 0.8 x10 3/mm — ABNORMAL LOW (ref 1.0–3.6)
MCHC: 32.2 g/dL (ref 32.0–36.0)
MCV: 91 fL (ref 80–100)
Monocyte %: 15.8 %
Neutrophil #: 2.5 x10 3/mm (ref 1.4–6.5)
Neutrophil %: 60.4 %
Platelet: 181 x10 3/mm (ref 150–440)
RBC: 2.32 10*6/uL — ABNORMAL LOW (ref 3.80–5.20)

## 2012-04-10 ENCOUNTER — Ambulatory Visit: Payer: Self-pay | Admitting: Internal Medicine

## 2012-04-25 ENCOUNTER — Inpatient Hospital Stay: Payer: Self-pay | Admitting: Internal Medicine

## 2012-04-25 LAB — CBC WITH DIFFERENTIAL/PLATELET
Eosinophil %: 0.1 %
HCT: 26.7 % — ABNORMAL LOW (ref 35.0–47.0)
MCH: 26.6 pg (ref 26.0–34.0)
MCHC: 32.1 g/dL (ref 32.0–36.0)
Monocyte #: 0.5 x10 3/mm (ref 0.2–0.9)
Monocyte %: 5.4 %
Neutrophil %: 88.7 %
RBC: 3.22 10*6/uL — ABNORMAL LOW (ref 3.80–5.20)
WBC: 9.7 10*3/uL (ref 3.6–11.0)

## 2012-04-25 LAB — COMPREHENSIVE METABOLIC PANEL
Albumin: 3 g/dL — ABNORMAL LOW (ref 3.4–5.0)
Alkaline Phosphatase: 121 U/L (ref 50–136)
Anion Gap: 6 — ABNORMAL LOW (ref 7–16)
BUN: 33 mg/dL — ABNORMAL HIGH (ref 7–18)
Bilirubin,Total: 0.4 mg/dL (ref 0.2–1.0)
Calcium, Total: 9 mg/dL (ref 8.5–10.1)
Creatinine: 0.83 mg/dL (ref 0.60–1.30)
EGFR (African American): >60
EGFR (Non-African Amer.): >60
Glucose: 109 mg/dL — ABNORMAL HIGH (ref 65–99)
Osmolality: 284 (ref 275–301)
SGOT(AST): 41 U/L — ABNORMAL HIGH (ref 15–37)
SGPT (ALT): 18 U/L (ref 12–78)
Sodium: 138 mmol/L (ref 136–145)

## 2012-04-26 LAB — BASIC METABOLIC PANEL
Calcium, Total: 8.6 mg/dL (ref 8.5–10.1)
Chloride: 105 mmol/L (ref 98–107)
Creatinine: 0.97 mg/dL (ref 0.60–1.30)
EGFR (Non-African Amer.): 51 — ABNORMAL LOW
Glucose: 131 mg/dL — ABNORMAL HIGH (ref 65–99)
Osmolality: 284 (ref 275–301)
Sodium: 137 mmol/L (ref 136–145)

## 2012-04-27 LAB — BASIC METABOLIC PANEL
BUN: 48 mg/dL — ABNORMAL HIGH (ref 7–18)
Calcium, Total: 8.7 mg/dL (ref 8.5–10.1)
Creatinine: 1.4 mg/dL — ABNORMAL HIGH (ref 0.60–1.30)
Glucose: 120 mg/dL — ABNORMAL HIGH (ref 65–99)
Potassium: 4.3 mmol/L (ref 3.5–5.1)
Sodium: 137 mmol/L (ref 136–145)

## 2012-04-27 LAB — URINALYSIS, COMPLETE
Bacteria: NONE SEEN
Hyaline Cast: 4
Nitrite: NEGATIVE
Protein: NEGATIVE
Specific Gravity: 1.012 (ref 1.003–1.030)
Squamous Epithelial: NONE SEEN

## 2012-04-29 LAB — BASIC METABOLIC PANEL
BUN: 47 mg/dL — ABNORMAL HIGH (ref 7–18)
Co2: 26 mmol/L (ref 21–32)
Creatinine: 0.94 mg/dL (ref 0.60–1.30)
EGFR (African American): >60
EGFR (Non-African Amer.): 53 — ABNORMAL LOW
Glucose: 98 mg/dL (ref 65–99)
Osmolality: 288 (ref 275–301)
Potassium: 4.6 mmol/L (ref 3.5–5.1)
Sodium: 138 mmol/L (ref 136–145)

## 2012-05-10 ENCOUNTER — Inpatient Hospital Stay: Payer: Self-pay | Admitting: Internal Medicine

## 2012-05-10 ENCOUNTER — Ambulatory Visit: Payer: Self-pay | Admitting: Internal Medicine

## 2012-05-10 LAB — BASIC METABOLIC PANEL
Anion Gap: 5 — ABNORMAL LOW (ref 7–16)
BUN: 36 mg/dL — ABNORMAL HIGH (ref 7–18)
Calcium, Total: 8.8 mg/dL (ref 8.5–10.1)
Chloride: 107 mmol/L (ref 98–107)
Co2: 24 mmol/L (ref 21–32)
Creatinine: 0.95 mg/dL (ref 0.60–1.30)
EGFR (Non-African Amer.): 52 — ABNORMAL LOW
Osmolality: 283 (ref 275–301)
Potassium: 4.5 mmol/L (ref 3.5–5.1)
Sodium: 136 mmol/L (ref 136–145)

## 2012-05-10 LAB — CBC
MCV: 79 fL — ABNORMAL LOW (ref 80–100)
Platelet: 99 10*3/uL — ABNORMAL LOW (ref 150–440)
RBC: 3.93 10*6/uL (ref 3.80–5.20)
WBC: 9.8 10*3/uL (ref 3.6–11.0)

## 2012-05-10 LAB — TROPONIN I: Troponin-I: 0.29 ng/mL — ABNORMAL HIGH

## 2012-05-10 LAB — PRO B NATRIURETIC PEPTIDE: B-Type Natriuretic Peptide: 56559 pg/mL — ABNORMAL HIGH (ref 0–450)

## 2012-05-10 LAB — CK TOTAL AND CKMB (NOT AT ARMC)
CK, Total: 40 U/L (ref 21–215)
CK-MB: 3.9 ng/mL — ABNORMAL HIGH (ref 0.5–3.6)

## 2012-05-11 LAB — CBC WITH DIFFERENTIAL/PLATELET
Basophil #: 0 10*3/uL (ref 0.0–0.1)
Eosinophil %: 0 %
Lymphocyte %: 3.7 %
MCH: 24.5 pg — ABNORMAL LOW (ref 26.0–34.0)
MCV: 78 fL — ABNORMAL LOW (ref 80–100)
Monocyte #: 0.1 x10 3/mm — ABNORMAL LOW (ref 0.2–0.9)
Monocyte %: 1.8 %
Neutrophil #: 5 10*3/uL (ref 1.4–6.5)
Neutrophil %: 94.4 %
RDW: 19.7 % — ABNORMAL HIGH (ref 11.5–14.5)
WBC: 5.3 10*3/uL (ref 3.6–11.0)

## 2012-05-11 LAB — CK TOTAL AND CKMB (NOT AT ARMC)
CK, Total: 27 U/L (ref 21–215)
CK-MB: 3.2 ng/mL (ref 0.5–3.6)
CK-MB: 2.9 ng/mL (ref 0.5–3.6)

## 2012-05-11 LAB — BASIC METABOLIC PANEL
BUN: 41 mg/dL — ABNORMAL HIGH (ref 7–18)
Chloride: 105 mmol/L (ref 98–107)
Co2: 28 mmol/L (ref 21–32)
Creatinine: 1.05 mg/dL (ref 0.60–1.30)
EGFR (African American): 54 — ABNORMAL LOW
EGFR (Non-African Amer.): 46 — ABNORMAL LOW
Osmolality: 288 (ref 275–301)
Potassium: 3.5 mmol/L (ref 3.5–5.1)
Sodium: 139 mmol/L (ref 136–145)

## 2012-05-11 LAB — TROPONIN I: Troponin-I: 0.25 ng/mL — ABNORMAL HIGH

## 2012-05-12 LAB — CBC WITH DIFFERENTIAL/PLATELET
Eosinophil #: 0 10*3/uL (ref 0.0–0.7)
HGB: 9.7 g/dL — ABNORMAL LOW (ref 12.0–16.0)
Lymphocyte %: 4.5 %
MCHC: 32.6 g/dL (ref 32.0–36.0)
Monocyte #: 0.5 x10 3/mm (ref 0.2–0.9)
Monocyte %: 6.1 %
Neutrophil #: 6.7 10*3/uL — ABNORMAL HIGH (ref 1.4–6.5)
Platelet: 70 10*3/uL — ABNORMAL LOW (ref 150–440)
RDW: 20.1 % — ABNORMAL HIGH (ref 11.5–14.5)
WBC: 7.6 10*3/uL (ref 3.6–11.0)

## 2012-05-12 LAB — BASIC METABOLIC PANEL
Anion Gap: 6 — ABNORMAL LOW (ref 7–16)
BUN: 49 mg/dL — ABNORMAL HIGH (ref 7–18)
Calcium, Total: 8.9 mg/dL (ref 8.5–10.1)
EGFR (African American): 54 — ABNORMAL LOW
Glucose: 85 mg/dL (ref 65–99)
Potassium: 2.9 mmol/L — ABNORMAL LOW (ref 3.5–5.1)

## 2012-05-13 LAB — BASIC METABOLIC PANEL
Anion Gap: 7 (ref 7–16)
Co2: 30 mmol/L (ref 21–32)
EGFR (African American): >60
Potassium: 2.8 mmol/L — ABNORMAL LOW (ref 3.5–5.1)

## 2012-05-13 LAB — CBC WITH DIFFERENTIAL/PLATELET
Basophil #: 0 10*3/uL (ref 0.0–0.1)
Basophil %: 0.5 %
Eosinophil #: 0.1 10*3/uL (ref 0.0–0.7)
Eosinophil %: 0.7 %
Lymphocyte #: 0.3 10*3/uL — ABNORMAL LOW (ref 1.0–3.6)
Lymphocyte %: 4.4 %
Neutrophil #: 6.3 10*3/uL (ref 1.4–6.5)
Neutrophil %: 88 %
WBC: 7.2 10*3/uL (ref 3.6–11.0)

## 2012-05-14 LAB — DIFFERENTIAL
Eosinophil: 2 %
Lymphocytes: 3 %
Segmented Neutrophils: 88 %

## 2012-05-14 LAB — BASIC METABOLIC PANEL
Anion Gap: 3 — ABNORMAL LOW (ref 7–16)
BUN: 39 mg/dL — ABNORMAL HIGH (ref 7–18)
Calcium, Total: 8.9 mg/dL (ref 8.5–10.1)
Chloride: 100 mmol/L (ref 98–107)
EGFR (Non-African Amer.): 54 — ABNORMAL LOW
Glucose: 91 mg/dL (ref 65–99)
Potassium: 3.5 mmol/L (ref 3.5–5.1)

## 2012-05-14 LAB — LACTATE DEHYDROGENASE: LDH: 265 U/L — ABNORMAL HIGH (ref 81–246)

## 2012-05-14 LAB — IRON AND TIBC: Unbound Iron-Bind.Cap.: 352 ug/dL

## 2012-05-15 LAB — CBC WITH DIFFERENTIAL/PLATELET
Basophil #: 0.1 10*3/uL (ref 0.0–0.1)
Basophil %: 0.7 %
Lymphocyte #: 0.4 10*3/uL — ABNORMAL LOW (ref 1.0–3.6)
MCH: 24.4 pg — ABNORMAL LOW (ref 26.0–34.0)
MCHC: 32 g/dL (ref 32.0–36.0)
Monocyte #: 0.5 x10 3/mm (ref 0.2–0.9)
Monocyte %: 7.2 %
Neutrophil #: 6.3 10*3/uL (ref 1.4–6.5)
Neutrophil %: 84.7 %
Platelet: 46 10*3/uL — ABNORMAL LOW (ref 150–440)
RBC: 3.81 10*6/uL (ref 3.80–5.20)

## 2012-05-29 ENCOUNTER — Inpatient Hospital Stay: Payer: Self-pay | Admitting: Internal Medicine

## 2012-05-29 LAB — TROPONIN I
Troponin-I: 0.54 ng/mL — ABNORMAL HIGH
Troponin-I: 3 ng/mL — ABNORMAL HIGH

## 2012-05-29 LAB — CK TOTAL AND CKMB (NOT AT ARMC)
CK, Total: 24 U/L (ref 21–215)
CK, Total: 57 U/L (ref 21–215)
CK-MB: 2.8 ng/mL (ref 0.5–3.6)
CK-MB: 7.5 ng/mL — ABNORMAL HIGH (ref 0.5–3.6)

## 2012-05-29 LAB — COMPREHENSIVE METABOLIC PANEL
Albumin: 3 g/dL — ABNORMAL LOW (ref 3.4–5.0)
Anion Gap: 6 — ABNORMAL LOW (ref 7–16)
Calcium, Total: 9.6 mg/dL (ref 8.5–10.1)
Chloride: 102 mmol/L (ref 98–107)
Co2: 26 mmol/L (ref 21–32)
Creatinine: 0.77 mg/dL (ref 0.60–1.30)
EGFR (Non-African Amer.): >60
Glucose: 150 mg/dL — ABNORMAL HIGH (ref 65–99)
Osmolality: 283 (ref 275–301)
SGOT(AST): 28 U/L (ref 15–37)
SGPT (ALT): 21 U/L (ref 12–78)
Total Protein: 7.2 g/dL (ref 6.4–8.2)

## 2012-05-29 LAB — CBC
HCT: 20.6 % — ABNORMAL LOW (ref 35.0–47.0)
MCV: 84 fL (ref 80–100)

## 2012-05-29 LAB — PRO B NATRIURETIC PEPTIDE: B-Type Natriuretic Peptide: 32588 pg/mL — ABNORMAL HIGH (ref 0–450)

## 2012-05-30 LAB — BASIC METABOLIC PANEL
Anion Gap: 7 (ref 7–16)
Chloride: 107 mmol/L (ref 98–107)
Co2: 24 mmol/L (ref 21–32)
Creatinine: 0.7 mg/dL (ref 0.60–1.30)
EGFR (Non-African Amer.): >60
Glucose: 122 mg/dL — ABNORMAL HIGH (ref 65–99)
Potassium: 3.7 mmol/L (ref 3.5–5.1)
Sodium: 138 mmol/L (ref 136–145)

## 2012-05-30 LAB — CBC WITH DIFFERENTIAL/PLATELET
Basophil %: 0.5 %
Eosinophil #: 0 10*3/uL (ref 0.0–0.7)
Eosinophil %: 0.3 %
HCT: 24.3 % — ABNORMAL LOW (ref 35.0–47.0)
Lymphocyte #: 0.6 10*3/uL — ABNORMAL LOW (ref 1.0–3.6)
Lymphocyte %: 7 %
MCV: 85 fL (ref 80–100)
Monocyte %: 7.5 %
Neutrophil #: 6.7 10*3/uL — ABNORMAL HIGH (ref 1.4–6.5)
Neutrophil %: 84.7 %
Platelet: 138 10*3/uL — ABNORMAL LOW (ref 150–440)
RBC: 2.86 10*6/uL — ABNORMAL LOW (ref 3.80–5.20)
RDW: 22.6 % — ABNORMAL HIGH (ref 11.5–14.5)
WBC: 7.9 10*3/uL (ref 3.6–11.0)

## 2012-05-30 LAB — HEMOGLOBIN: HGB: 9.2 g/dL — ABNORMAL LOW (ref 12.0–16.0)

## 2012-05-30 LAB — CK TOTAL AND CKMB (NOT AT ARMC): CK-MB: 8.6 ng/mL — ABNORMAL HIGH (ref 0.5–3.6)

## 2012-05-30 LAB — TROPONIN I: Troponin-I: 5.4 ng/mL — ABNORMAL HIGH

## 2012-05-31 LAB — BASIC METABOLIC PANEL
BUN: 37 mg/dL — ABNORMAL HIGH (ref 7–18)
Calcium, Total: 8.3 mg/dL — ABNORMAL LOW (ref 8.5–10.1)
Creatinine: 1.11 mg/dL (ref 0.60–1.30)
EGFR (African American): 50 — ABNORMAL LOW
EGFR (Non-African Amer.): 43 — ABNORMAL LOW
Glucose: 111 mg/dL — ABNORMAL HIGH (ref 65–99)
Osmolality: 279 (ref 275–301)
Potassium: 3.6 mmol/L (ref 3.5–5.1)
Sodium: 135 mmol/L — ABNORMAL LOW (ref 136–145)

## 2012-05-31 LAB — CBC WITH DIFFERENTIAL/PLATELET
Basophil #: 0 10*3/uL (ref 0.0–0.1)
Basophil %: 0.4 %
HCT: 26.1 % — ABNORMAL LOW (ref 35.0–47.0)
HGB: 9.1 g/dL — ABNORMAL LOW (ref 12.0–16.0)
Lymphocyte #: 0.6 10*3/uL — ABNORMAL LOW (ref 1.0–3.6)
Lymphocyte %: 8.5 %
MCHC: 35 g/dL (ref 32.0–36.0)
Monocyte #: 0.5 x10 3/mm (ref 0.2–0.9)
Neutrophil #: 5.8 10*3/uL (ref 1.4–6.5)
Neutrophil %: 82.1 %
Platelet: 154 10*3/uL (ref 150–440)
WBC: 7 10*3/uL (ref 3.6–11.0)

## 2012-05-31 LAB — TROPONIN I: Troponin-I: 2.3 ng/mL — ABNORMAL HIGH

## 2012-06-10 ENCOUNTER — Ambulatory Visit: Payer: Self-pay | Admitting: Internal Medicine

## 2012-06-10 DEATH — deceased

## 2012-07-03 IMAGING — NM NUCLEAR MEDICINE WHOLE BODY BONE SCINTIGRAPHY
2 series · 12 of 12 positions shown · non-contrast
Comparison: none

REASON FOR EXAM: spine pelvis  back pain
COMMENTS:

[Series 1000: statics · 2.40mm/px · 5 acquisitions, 10 frames shown]
[im 1/5]
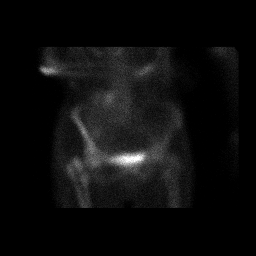
[im 1/5]
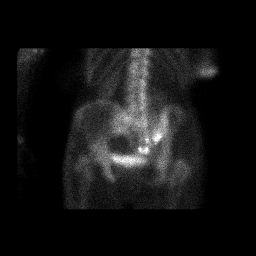
[im 2/5]
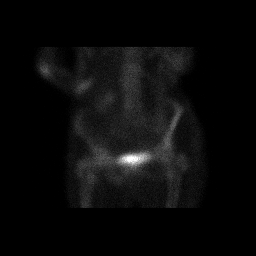
[im 2/5]
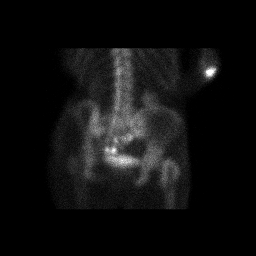
[im 3/5]
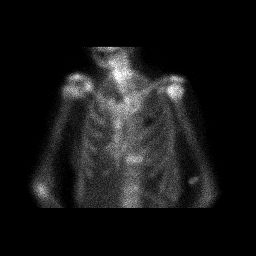
[im 3/5]
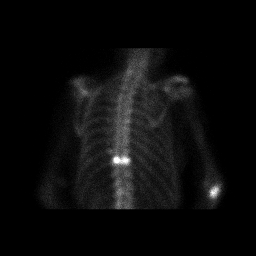
[im 4/5]
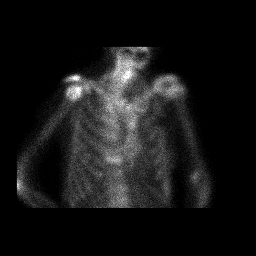
[im 4/5]
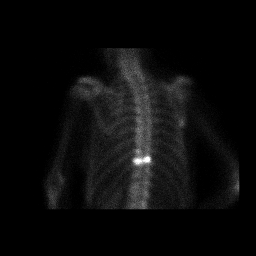
[im 5/5]
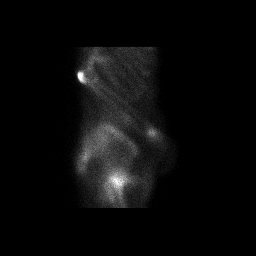
[im 5/5]
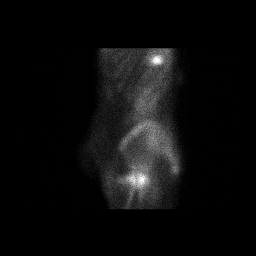

[Series 1000: 3 hr wholebody · 2.40mm/px · 2 of 2 frames shown]
[frame 1/2]
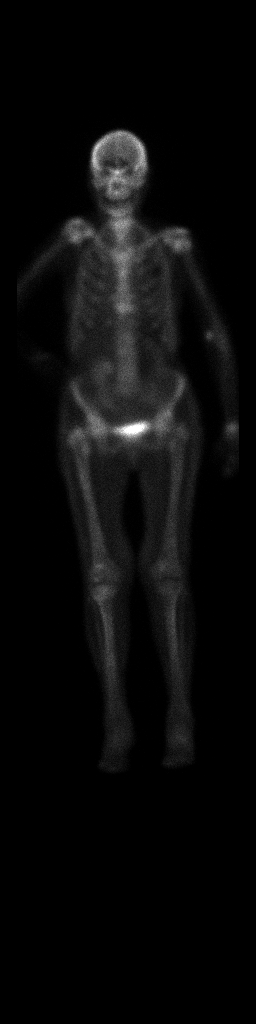
[frame 2/2]
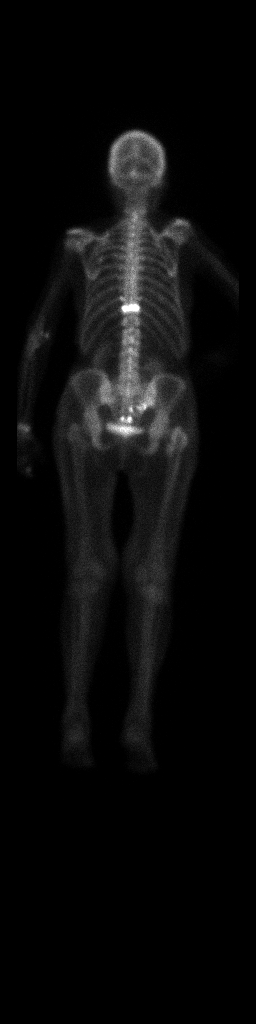

[12 of 12 positions shown; findings below may reference images not displayed]

PROCEDURE:     KNM - KNM BONE WB 3HR [DATE]  [DATE]

RESULT:     Following intravenous administration of 22.66 mCi technetium 99m
MDP, total-body bone scan was performed. There is observed increased tracer
activity at T11 where a vertebral compression deformity is noted at prior
CT. No other abnormal areas of increased tracer activity are seen. There is
decreased tracer activity in the proximal right femur consistent with the
presence of an intramedullary rod as is noted in the clinical history.
Tracer activity is observed in the kidneys and the urinary bladder.
IMPRESSION: 1.     There is increased tracer activity at the T11 level of the thoracic
spine. Posttraumatic change would be the primary consideration as to
etiology.

## 2013-02-07 IMAGING — CR DG CHEST 2V
1 series · 2 of 2 positions shown · non-contrast
Comparison: none

REASON FOR EXAM: Rib fx
COMMENTS:

PROCEDURE:     DXR - DXR CHEST PA (OR AP) AND LATERAL  - October 09, 2011  [DATE]
RESULT:     Comparison: 10/08/2011, 10/06/2011

[Series 2: x chest ap · 0.14mm/px · 2 of 2 slices shown]
[im 1/2]
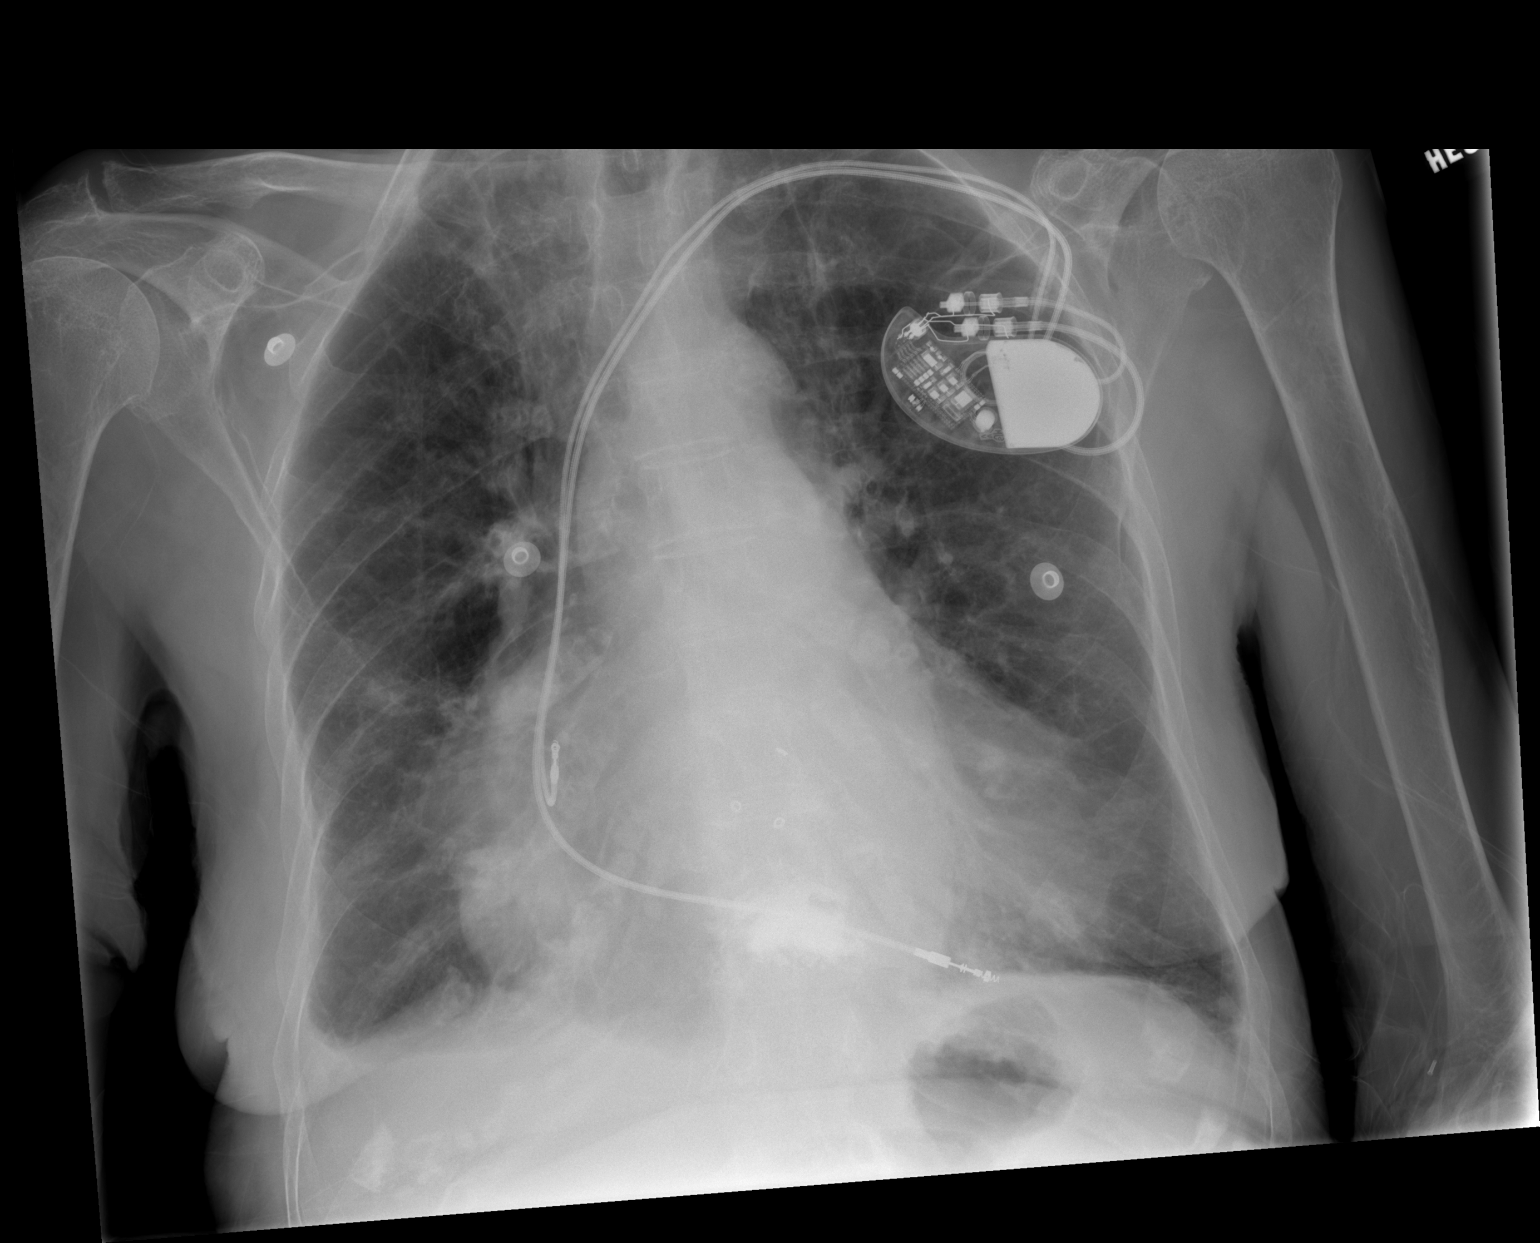
[im 2/2]
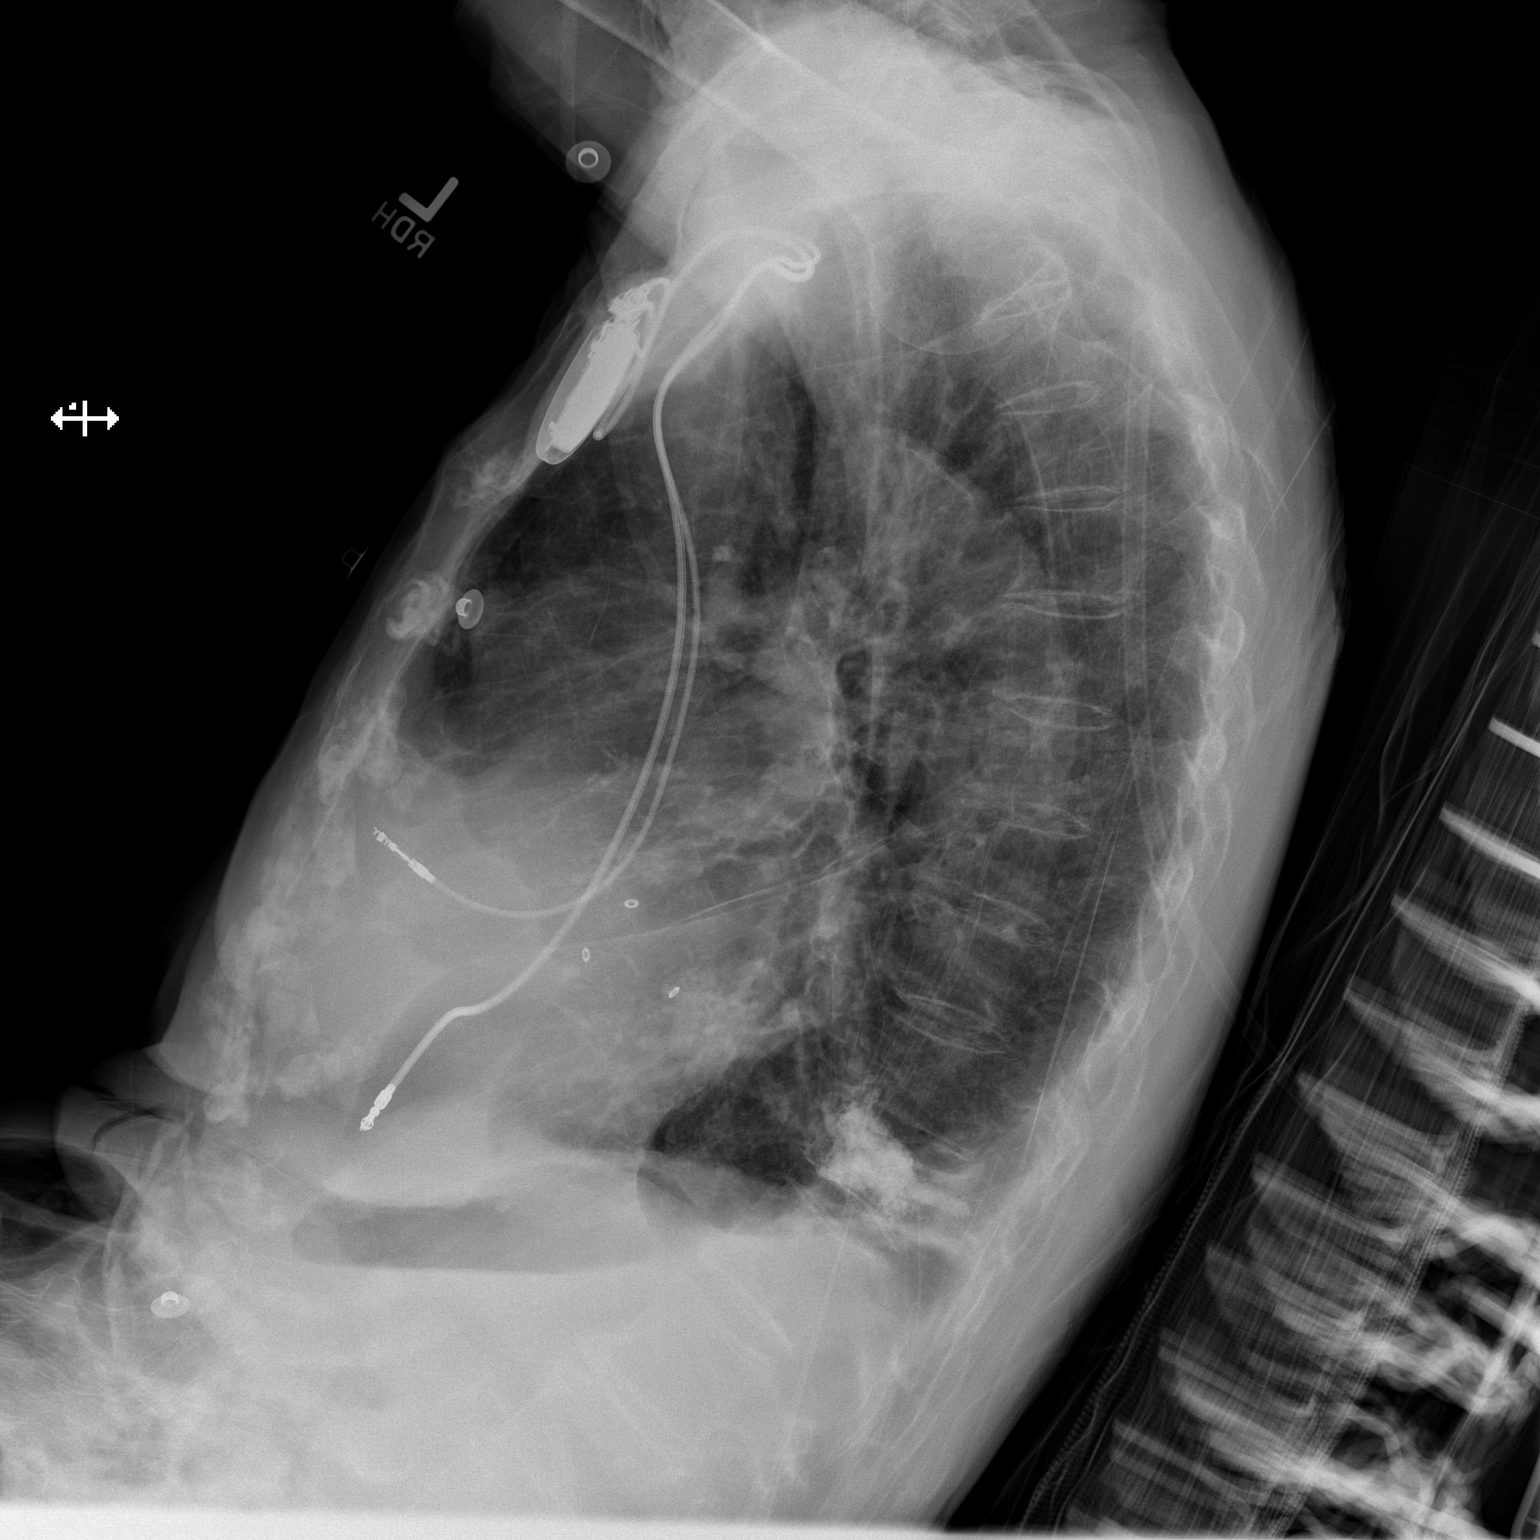

[2 of 2 positions shown; findings below may reference images not displayed]

FINDINGS: PA and lateral chest radiographs are provided. There is a small right
pleural effusion. There is no pneumothorax. There is bilateral diffuse mild
interstitial thickening likely chronic. There is a dual-lead cardiac pacer.
The heart size is enlarged.. There is a nondisplaced right lateral ninth rib
fracture better delineated on the dedicated rib series dated 10/08/2011.
IMPRESSION: Nondisplaced right lateral ninth rib fracture better delineated on the
dedicated rib series dated 10/08/2011. Small right pleural effusion.

No pneumothorax.

[REDACTED]

## 2013-02-07 IMAGING — CR RIGHT HIP - COMPLETE 2+ VIEW
1 series · 2 of 2 positions shown · non-contrast
Comparison: none

REASON FOR EXAM: fall with hip pain and hx of fracture and repair
COMMENTS:

PROCEDURE:     DXR - DXR HIP RIGHT COMPLETE  - October 09, 2011  [DATE]
RESULT:     Comparison:  None

[Series 1: ap · 0.17mm/px · 2 of 2 slices shown]
[im 1/2]
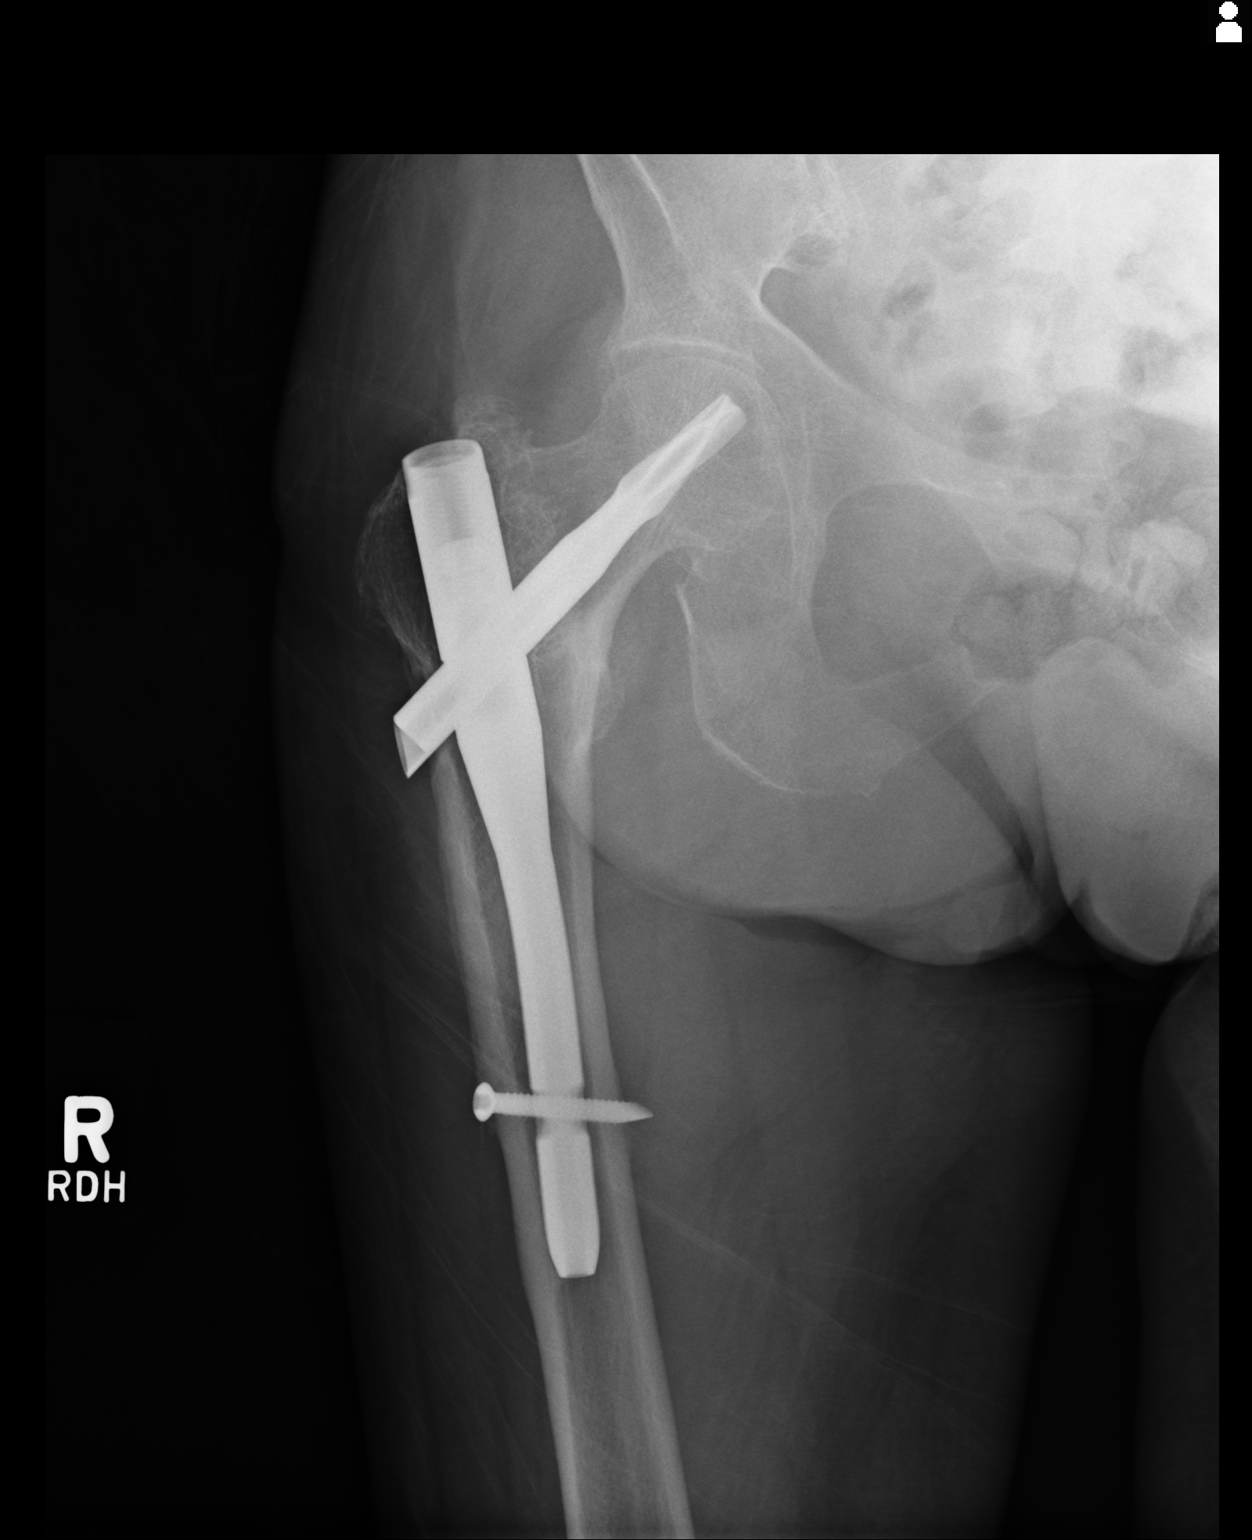
[im 2/2]
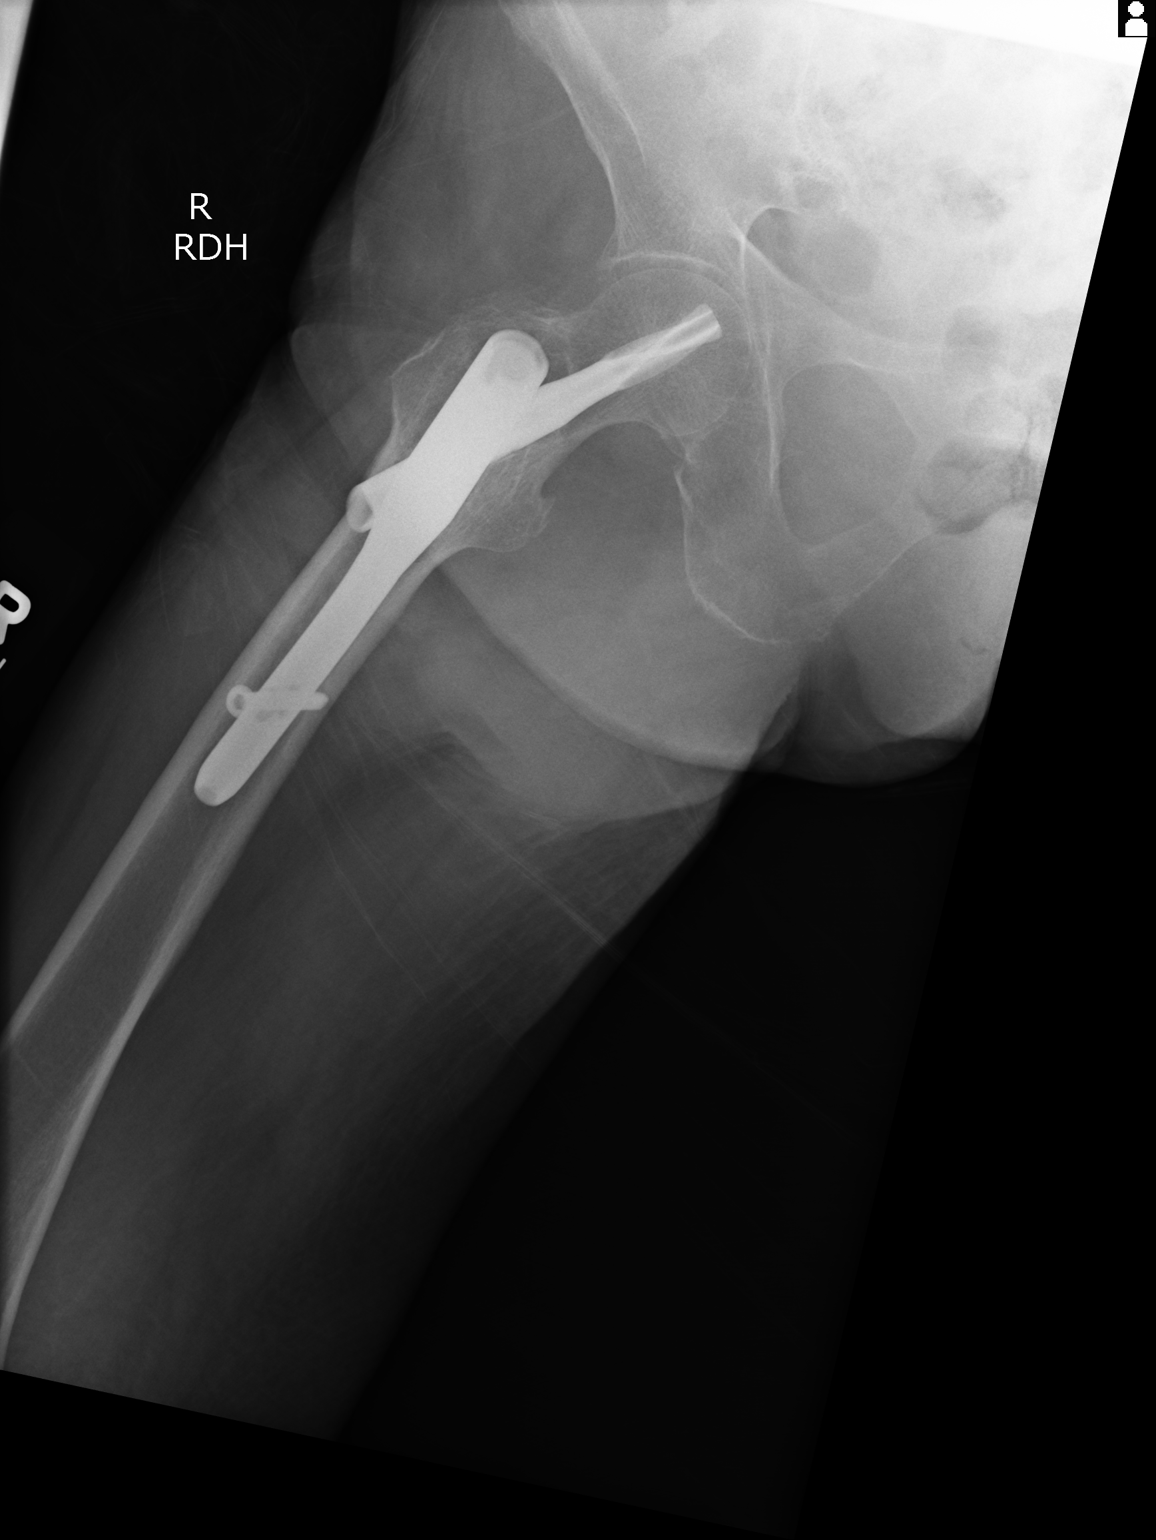

[2 of 2 positions shown; findings below may reference images not displayed]

FINDINGS: AP and frogleg lateral view of the right hip demonstrates no fracture or
dislocation. There is evidence of prior right femoral intramedullary nail
placement with interlocking femoral neck screw transfixing a
intertrochanteric fracture. There is no hardware failure or complication.
The joint space is maintained.
IMPRESSION: Please see above.

[REDACTED]

## 2013-09-30 IMAGING — CR DG RIBS 2V*R*
1 series · 2 of 2 positions shown · non-contrast
Comparison: none

REASON FOR EXAM: pain on right side after fall in [place]
COMMENTS:

PROCEDURE:     DXR - DXR RIBS RIGHT UNILATERAL  - May 31, 2012 [DATE]
RESULT:     Comparison: 10/08/2011

[Series 1: ap · 0.17mm/px · 2 of 2 slices shown]
[im 1/2]
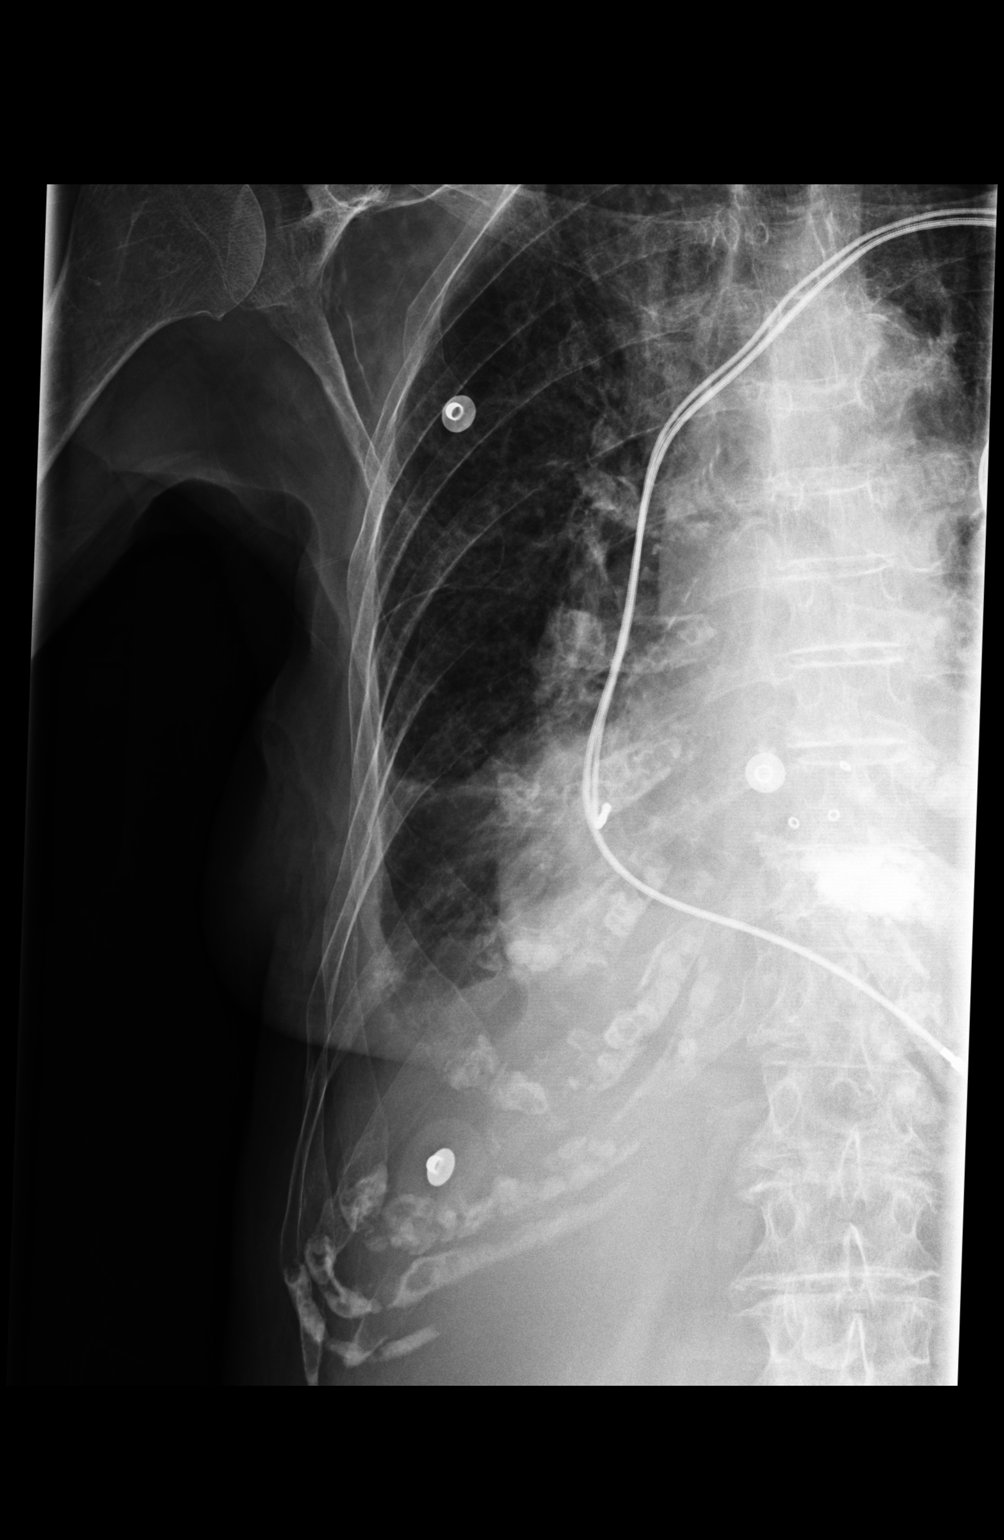
[im 2/2]
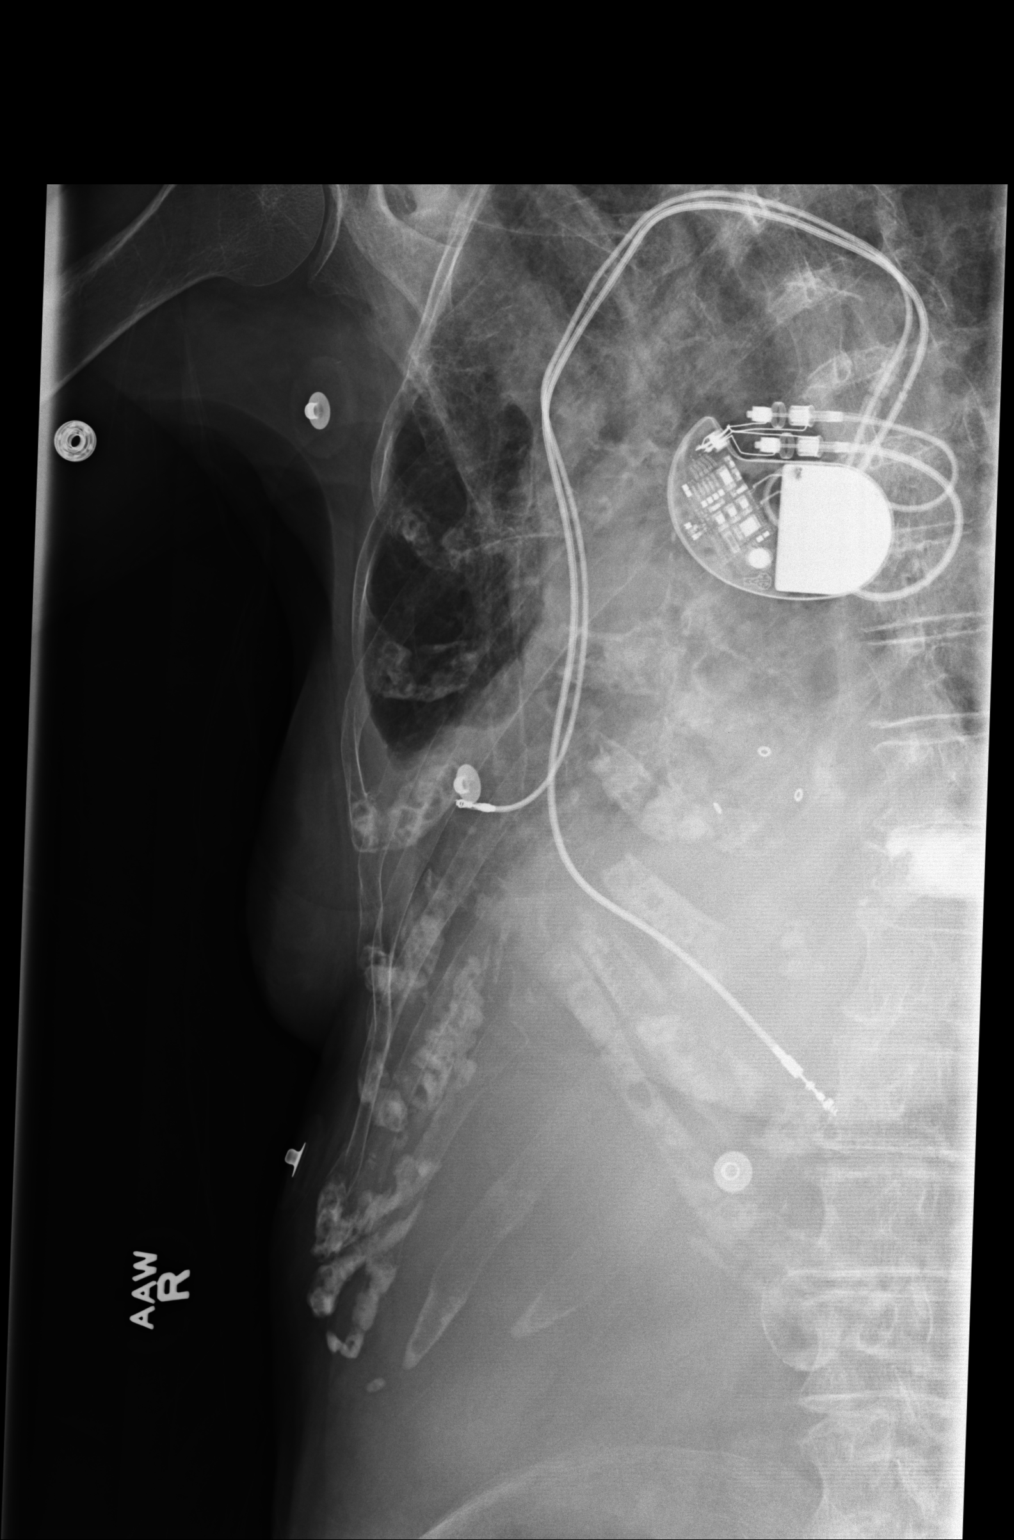

[2 of 2 positions shown; findings below may reference images not displayed]

FINDINGS: There are possible nondisplaced fractures of the lateral 9 and 11 ribs. No
definite pneumothorax. There is an mild pleural thickening versus small
pleural effusion. This is similar to prior.
IMPRESSION: Possible nondisplaced fractures of the lateral ninth and eleventh ribs.

[REDACTED]

## 2014-04-29 NOTE — H&P (Signed)
PATIENT NAME:  [name] [name] MR#:  [id number] DATE OF BIRTH:  [birth year]  DATE OF ADMISSION:  10/08/2011  PRIMARY CARE PHYSICIAN: Bethann PunchesMark Miller, MD   CARDIOLOGIST: Harold HedgeKenneth Fath, MD    ATTENDING PHYSICIAN: Quentin Orealph L. Ely, III, MD   CHIEF COMPLAINT: Chest pain after a fall.   BRIEF HISTORY: [name] [name] is a 81 year old woman seen in the Emergency Room after a fall this morning. She was in her usual state of good health having seen her cardiologist just 48 hours ago for a routine checkup. She was reaching for something in the refrigerator at home, slipped and fell, striking the right side of her body, complaining of right chest pain. She presented to the Emergency Room via [facility]. Extensive imaging was performed. She was noted to have a single nondisplaced right lateral rib fracture. Pneumothorax was noted, and there was a small amount of pleural fluid seen on her right chest. She had normal oxygen saturation, no obvious shortness of breath. Her husband has recently undergone a knee replacement. They live together home at home with help from family and caregivers. However, she cannot care for her husband, and the situation is that he cannot look after her. With her risk of pneumonia and pneumothorax, we elected to admit her to the hospital for observation and pain control.   PAST MEDICAL HISTORY: She has a history of multiple medical problems including severe congestive heart likely due to aortic stenosis and pulmonary hypertension. She has had a mitral valve replacement in the past, chronic atrial fibrillation, recent diagnosis of GI angiodysplasia and possible GI bleed. She is followed regularly by Dr. Bethann PunchesMark Miller and Dr. Harold HedgeKenneth Fath.   PAST SURGICAL HISTORY: Previous surgery included: 1. Right hip fracture with hip replacement.  2. Left hip fracture with pinning. 3. Mitral valve replacement on two separate occasions. 4. Kyphoplasty. 5. Hysterectomy. 6. Right wrist surgery  for fracture. 7. Right upper extremity surgery for fracture.   CURRENT MEDICATIONS:  1. Advair 250/50 mg [name].i.d. 2. Torsemide 20 mg, 1-1/2 tablets a day.  3. Iron 325 mg p.o. [name].i.d.  4. Metoprolol 25 mg p.o. a day. 5. Potassium 10 mEq q.i.d. or 40 at one time. 6. Calcium with vitamin D.   SOCIAL HISTORY: She has no significant social history, denying any cigarette use or alcohol usage.   FAMILY HISTORY: Noncontributory.   REVIEW OF SYSTEMS: Review of systems is very difficult as she has been sedated and is little confused.   PHYSICAL EXAMINATION:  GENERAL: She is arousable but sedated and a bit confused.   VITAL SIGNS: Blood pressure is 142/76. Heart rate 82 and regular. Oxygen saturations 92% on room air.   HEENT: Examination is unremarkable. There is no scleral icterus. No scleral hemorrhage. She has no facial deformities.   NECK: Supple, nontender, without adenopathy. Trachea is midline.   CHEST: Clear. She has some tenderness along the right chest wall, but I cannot palpate a stepoff or a  rib fracture. She does not have any obvious flail segment, and the breath sounds are distant but equal.   CARDIAC: Loud murmur with a precordial thrill. Systolic murmur probably grade 4 out of 6. She is at a paced rhythm, and I do not hear any cardiac dysrhythmia.   ABDOMEN: Benign and mildly distended with no organomegaly.   EXTREMITIES: Lower extremity exam reveals edema with no deformity and some mild right lower extremity dysfunction which she relates is chronic.   PSYCHIATRIC: Exam reveals normal orientation but  some confusion in her affect.   IMPRESSION/PLAN: I reviewed her x-rays. She appears to have a  small nondisplaced rib fracture on the right side with small pleural effusion. There is no evidence of any pneumothorax. She is having significant pain issues and is sedated. She cannot care for herself at home in the circumstance. We will admit her to the hospital for pain control and  observation for further complication of chest injury with pneumothorax or pneumonia and get Care Management to assist in post hospital planning.   This plan has been discussed with the patient, and she is in agreement. We will ask for an Internal Medicine consult to assist [place] with her multiple medical problems.  ____________________________ Carmie End, MD rle:cbb D: 10/08/2011 17:07:58 ET T: 10/09/2011 09:22:22 ET JOB#: [id number]  cc: Carmie End, MD, <Dictator> Danella Penton, MD Darlin Priestly. Lady Gary, MD Quentin Ore MD ELECTRONICALLY SIGNED 10/09/2011 16:26

## 2014-04-29 NOTE — Consult Note (Signed)
PATIENT NAME:  [name] [name], [name] [name] MR#:  [id number] DATE OF BIRTH:  03-04-1918  DATE OF CONSULTATION:  10/09/2011  REFERRING PHYSICIAN:  Dr. Pat Patrick  CONSULTING PHYSICIAN:  Ocie Cornfield. Ouida Sills, MD  REASON FOR CONSULTATION: Congestive heart failure, recent fall with rib fracture.   HISTORY OF PRESENT ILLNESS: [name] [name] is a very pleasant 81 year old female with history of osteoporosis, cardiomyopathy with aortic stenosis. She slipped trying to reach up in the refrigerator, now has a rib fracture and a small pneumothorax noted. Dr. Pat Patrick admitted her following this and asked [place] to see her to ensure things were stable medically. She did have hypokalemia this morning that was replaced already. She is breathing well. She has no edema. She has some pain in her chest that is not severe. The biggest pain is in her hip at this point. She says she's not able to walk. History of hip fracture. She had multiple x-rays on admission, though not of the right hip at the time. She did have some right knee pain on admission. Plain x-rays of that were normal. She had a Doppler that was normal as well. She has been followed closely for her congestive heart failure by Dr. Sabra Heck as noted.   REVIEW OF SYSTEMS: Negative complete other than above.   PAST MEDICAL HISTORY PER [facility] RECORDS:  1. Cardiomyopathy. 2. Aortic stenosis.    3. Osteoporosis, severe, status post multiple fractures. 4. History of Raynaud's. 5. History of subarachnoid hemorrhage. 6. Atrial fibrillation. 7. Anemia with hemoglobin in May of 9.7, one in August of 5.6. She does get transfusions and is followed by Hematology for this.   PAST SURGICAL HISTORY:  1. Hysterectomy.  2. Right total hip replacement versus fracture per patient. 3. Left hip fracture as well and surgery. 4. Mitral valve replacement three times, porcine valves. 5. T11 kyphoplasty in 2013 by Dr. Mauri Pole.   MEDICATIONS: Reviewed. Meds from [facility] and meds  ordered here are the same except for p.r.n. metolazone at this point and no Vitamin D.   ALLERGIES: Codeine causes unspecified reaction. Fosamax causes unspecified reaction. Sulfa causes unspecified reaction.   SOCIAL HISTORY: She is a retired Pharmacist, hospital. Is married. No tobacco or alcohol.   FAMILY HISTORY: Coronary artery disease in mother and father. Her daughter died at 12 of an MI.   PHYSICAL EXAMINATION:   GENERAL: Elderly, pleasant, kyphosis sitting in chair in no acute distress.   VITALS: Temperature 98.8, pulse 61, respirations 18, blood pressure 105/58, pulse 58, pulse oximetry 92% on 1 liter.   HEENT: Normocephalic, atraumatic. TMs clear. OP clear.   NECK: No bruits, thyromegaly, adenopathy, or JVD.   CHEST: Diminished but clear.   HEART: Systolic murmur, late, soft. Peripheral pulses decreased. Rhythm is regular.   ABDOMEN: Protuberant, soft. Normal bowel sounds.   EXTREMITIES: Minimal edema, right greater than left.   MUSCULOSKELETAL: Effusion in the right knee. Marked limitation of movement in the right hip with pain, inability to externally rotate at all.   NEUROLOGIC: Nonfocal.   SKIN: No lesions noted.   LABORATORY, DIAGNOSTIC, AND RADIOLOGICAL DATA: Potassium 3.8 after correction this morning. Creatinine stable at 0.9. Hemoglobin is presumably stable at 8.2.   Multiple x-rays were noted. Doppler to rule out DVT was noted to be negative as noted above.   ASSESSMENT AND PLAN:  1. Cardiomyopathy with aortic stenosis, stable, with rib fracture, etc.  2. Hip pain, osteoporosis, knee effusion, all worrisome especially with the hip either with fracture near the  hardware versus hardware loosening, etc. Will get a plain film as well. May need to consider orthopedic consult. They did try to call Dr. Pat Patrick on his phone but I suspect he was in the operating room and could not answer. Will discuss this further if, in fact, there is no answer on the plain films. Right knee  effusion may need to be dealt with by orthopedics as well, although she is tolerating that as far as the pain presently but may have soft tissue injury there as well.  3. Anemia, seems to be stable. Will follow-up in the morning. Will follow-up Met-B in the morning as well.   ____________________________ Ocie Cornfield. Ouida Sills, MD mwa:drc D: 10/09/2011 11:38:59 ET T: 10/09/2011 13:58:49 ET JOB#: [id number]  cc: Ocie Cornfield. Ouida Sills, MD, <Dictator> Kirk Ruths MD ELECTRONICALLY SIGNED 10/10/2011 7:42

## 2014-05-02 NOTE — Consult Note (Signed)
Brief Consult Note: Diagnosis: AF with RVR secondary to GI bleed with marked anemia, should improve with transfusion.   Patient was seen by consultant.   Consult note dictated.   Comments: REC  Agree with current therapy, cont transfusion, resume po metoprolol when able, may repeat iv dig 0.125 iv Q 2h x 3 doses if symptomatic tachycardia.  Electronic Signatures: Marcina MillardParaschos, Mikhayla Phillis (MD)  (Signed 20-May-14 16:30)  Authored: Brief Consult Note   Last Updated: 20-May-14 16:30 by Marcina MillardParaschos, Ronasia Isola (MD)
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
## 2014-05-02 NOTE — Discharge Summary (Signed)
PATIENT NAME:  [name] [name], Aolanis B MR#:  [id number] DATE OF BIRTH:  10-Sep-1918  DATE OF ADMISSION:  05/29/2012 DATE OF DISCHARGE:  06/01/2012  DISCHARGE DIAGNOSES: 1.  Acute lower gastrointestinal bleed, with symptomatic anemia.  2.  Left-sided rib fractures.  3. Colonic angiodysplasias.  4.  Congestive heart failure, secondary to severe valvular heart disease.  5.  Osteoporosis.  6.  Chronic atrial fibrillation.   DISCHARGE MEDICATIONS: Norco 5/325, 1 q. 6 p.r.n., pain; Advair Diskus 100/50, 1 b.i.d., potassium chloride 10 mEq q.i.d., torsemide 20 mg daily, vitamin D3 1000 units daily, calcium 600 mg daily,  metoprolol tartrate 25 mg daily.   REASON FOR ADMISSION: A 81 year old female presents with acute lower GI bleed. Please see H and P for HPI, past medical history, and physical exam.   HOSPITAL COURSE: The patient was admitted. Did have slightly elevated cardiac enzymes thought due to demand ischemia. She had no chest pain. Her hemoglobin was 6.6, and she was transfused 2 units PRBCs. Hemoglobin came up to 9, was stable. She underwent colonoscopy showing severe colonic angiodysplasias. Underwent hemostasis with argon laser therapy. She had no recurrent bleeding. Hemoglobin was stable. No sign of decompensated CHF, and she will be going home under hospice care.   [facility] is planning to move home.    ____________________________ Danella PentonMark F. Nikie Cid, MD mfm:dm D: 06/01/2012 07:51:01 ET T: 06/01/2012 10:36:03 ET JOB#: [id number]  cc: Danella PentonMark F. Trenice Mesa, MD, <Dictator> [place] Sherlene ShamsF Nelson Noone MD ELECTRONICALLY SIGNED 2012/07/25 8:01

## 2014-05-02 NOTE — Consult Note (Signed)
Chief Complaint:   Subjective/Chief Complaint Hgb down from yest. Some rectal bleeding? Stool normally dark due to Fe.   VITAL SIGNS/ANCILLARY NOTES: **Vital Signs.:   08-Jan-14 04:47   Vital Signs Type Routine   Temperature Temperature (F) 97.8   Celsius 36.5   Temperature Source Oral   Pulse Pulse 55   Respirations Respirations 18   Systolic BP Systolic BP 117   Diastolic BP (mmHg) Diastolic BP (mmHg) 51   Mean BP 73   Pulse Ox % Pulse Ox % 91   Pulse Ox Activity Level  At rest   Oxygen Delivery Room Air/ 21 %   Brief Assessment:   Cardiac Regular    Respiratory clear BS    Gastrointestinal Normal   Lab Results: Routine Chem:  08-Jan-14 06:17    Potassium, Serum 3.8 (Result(s) reported on 18 Jan 2012 at 07:35AM.)   Magnesium, Serum  2.6 (1.8-2.4 THERAPEUTIC RANGE: 4-7 mg/dL TOXIC: > 10 mg/dL  -----------------------)  Routine Hem:  08-Jan-14 06:17    WBC (CBC) 3.8   RBC (CBC)  2.51   Hemoglobin (CBC)  7.1   Hematocrit (CBC)  22.4   Platelet Count (CBC)  139   MCV 90   MCH 28.5   MCHC  31.8   RDW  15.2   Neutrophil % 60.0   Lymphocyte % 21.0   Monocyte % 13.6   Eosinophil % 4.4   Basophil % 1.0   Neutrophil # 2.3   Lymphocyte #  0.8   Monocyte # 0.5   Eosinophil # 0.2   Basophil # 0.0 (Result(s) reported on 18 Jan 2012 at 07:35AM.)   Assessment/Plan:  Assessment/Plan:   Assessment Fe def anemia with symptomatic anemia. Pt already had video capsule study on 12/3 after her last colonoscopy. Video capsule study showed poss AVM's in distal small intestine. These are not reacheable by scope. Pt not interested in repeating colonoscopy again.    Plan At this time, we are limited in what we can do endoscopically to stop bleeding. Would transfuse as needed. Continue Fe infusions.   Electronic Signatures: Lutricia Feilh, Latoi Giraldo (MD)  (Signed 08-Jan-14 08:44)  Authored: Chief Complaint, VITAL SIGNS/ANCILLARY NOTES, Brief Assessment, Lab Results, Assessment/Plan   Last  Updated: 08-Jan-14 08:44 by Lutricia Feilh, Adaia Matthies (MD)

## 2014-05-02 NOTE — Discharge Summary (Signed)
PATIENT NAME:  [name], [name] MR#:  [id number] DATE OF BIRTH:  07-19-1918  DATE OF ADMISSION:  01/16/2012 DATE OF DISCHARGE:  01/18/2012  DISCHARGE DIAGNOSES: Severe iron deficiency anemia for blood transfusion and gastrointestinal bleed with known history of arteriovenous malformations.   HISTORY OF PRESENT ILLNESS: The patient is a 81 year old female with a history of multiple medical problems including recurrent iron deficiency anemia with history of recurrent GI bleeding. AV malformations have been found on GI work-up by Dr. Candace Cruise. The patient also had bone marrow biopsy November 2013, which was mostly unremarkable for any obvious marrow disorder. She also has history of prosthetic mitral valve porcine type, history of atrial fibrillation/flutter, status post pacemaker, rheumatic fever as a child, osteoporosis, constipation, Reynaud's phenomena, history of subarachnoid hemorrhage, blood clots in the right eye, hysterectomy, mitral valve replacement and appendectomy. The patient was admitted to hospital on January 06 when she presented with progressive severe anemia, severe weakness and dyspnea on exertion, hemoglobin of 5.1. She had noticed some bright red blood in the stools. She denied any chest pain. No falls or loss of consciousness. Oral intake was fair.   For past medical history, surgical history, family history, home medications, allergies, review of systems and exam - please refer to history and physical note for details.   HOSPITAL COURSE: Hemoglobin on admission was 5.1, platelets 201, WBC 4200. Iron studies showed severe iron deficiency anemia with iron saturation of 4%, iron 16, ferritin 23. Given her severe symptoms and hemoglobin of 5.1, the patient received 2 units of packed red blood cell transfusion on January 06. Hemoglobin was better on January 07 at 8.1. She continued to have a small amount of bright red blood in stools and GI was consulted and Dr. Candace Cruise saw the patient. He did not  have any further plan for investigation and recommended continued transfusion support and iron supplementation. On January 08, hemoglobin was slightly low at 7.1 grams, she was still weak and fatigued, but overall slowly improving. She, therefore, received 1 unit of packed red blood cell transfusion, after which she got 1 dose of IV ferriheme 510 mg for iron deficiency. The patient was then discharged home on January 08 with plan for close monitoring as an outpatient, for lab appointment on January 10  and to see M.D. at [facility] on January 14 with repeat labs and for continued transfusion support as indicated.   DISCHARGE DIET: Regular.   DISCHARGE MEDICATIONS: Magnesium oxide 400 mg daily, vitamin D2 1 tablet daily, Cetirizine 10 mg daily, Os-Cal plus D 1 tablet t.i.d. with meals, Norco 5/325 mg q. 6 hour p.r.n. for pain, Senna plus t.i.d. p.r.n. constipation, K-Dur 10 mEq daily. Torsemide 20 mg 1-1/2 tablets daily is on hold at this time given severe weakness, anemia and ongoing bleeding issues requiring hospitalization.   In between visits, the patient was advised to call or come to the ER in case of worsening symptoms or acute sickness.   DISCHARGE FOLLOWUP: Lab appointment at the [facility] on January 10. See Dr. Ma Hillock on January 14 at [facility] with repeat labs.    ____________________________ Rhett Bannister Ma Hillock, MD srp:aw D: 01/19/2012 10:21:48 ET T: 01/19/2012 10:34:08 ET JOB#: [id number]  cc: [name], MD, <Dictator> [name] MD ELECTRONICALLY SIGNED 01/19/2012 23:07

## 2014-05-02 NOTE — Consult Note (Signed)
Brief Consult Note: Diagnosis: CHF, probable acute on chronic diastolic, borderline elevated troponin, probale demand supply ischemia, mod-secere AS not felt to be a candidate for AVR or TAVR.   Patient was seen by consultant.   Consult note dictated.   Comments: REC  Agree with current therapy, defer full dose anticoagulation, defer chronic anticoagulation for AF due to bleeding risk, cont diuresis, may Tx to tele.  Electronic Signatures: Marcina MillardParaschos, Pang Robers (MD)  (Signed 03-May-14 10:08)  Authored: Brief Consult Note   Last Updated: 03-May-14 10:08 by Marcina MillardParaschos, Zanobia Griebel (MD)
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
## 2014-05-02 NOTE — Consult Note (Signed)
Pt known to me from previous endoscopies for heme positive stool and Fe def anemia.  EGD in 5/13 normal. Colon from 1/13,5/13, and and 12/13 showed right sided colon AVM's, which were cauterized.  For now, avoid or stop any blood thinners/ASA/NSAIDS. etc. Transfuse as needed. May need to repeat colon IF bleeding persists. Pt may also have small intestinal AVM's. Will consider video capsule study later as outpt. [facility] Will check back on Wed. Thanks.   Electronic Signatures: Lutricia Feilh, Tayjon Halladay (MD) (Signed on 06-Jan-14 17:12)  Authored   Last Updated: 08-Jan-14 08:45 by Lutricia Feilh, Kierah Goatley (MD)

## 2014-05-02 NOTE — Consult Note (Signed)
Chief Complaint:  Subjective/Chief Complaint Pt denies any further hematochezia.  Denies abdominal pain, nausea or vomiting.  Hgb 8.5.   VITAL SIGNS/ANCILLARY NOTES: **Vital Signs.:   21-May-14 11:15  Vital Signs Type Q 4hr  Temperature Temperature (F) 97.4  Celsius 36.3  Temperature Source oral  Pulse Pulse 54  Respirations Respirations 18  Systolic BP Systolic BP 98  Diastolic BP (mmHg) Diastolic BP (mmHg) 61  Mean BP 73  Pulse Ox % Pulse Ox % 95  Pulse Ox Activity Level  At rest  Oxygen Delivery 2L   Brief Assessment:  Cardiac Irregular  murmur present  -- LE edema  left ant pacemaker, 3/6 murmur   Respiratory normal resp effort   Gastrointestinal Normal   Gastrointestinal details normal Soft  Nontender  Nondistended  Bowel sounds normal  No rebound tenderness  No gaurding   Additional Physical Exam GEN: A/O x3, NAD.  Niece at bedside. HEENT: Conjunctivae pale Skin: Pale, warm, dry   Lab Results: Routine Chem:  21-May-14 05:17   Result Comment TROPONIN - RESULTS VERIFIED BY REPEAT TESTING.  - PREVIOUSLY CALLED AT 1335 05/29/12 BY [facility]  - ... LAB  Result(s) reported on 30 May 2012 at 06:06AM.  Glucose, Serum  122  BUN  40  Creatinine (comp) 0.70  Sodium, Serum 138  Potassium, Serum 3.7  Chloride, Serum 107  CO2, Serum 24  Calcium (Total), Serum 8.5  Anion Gap 7  Osmolality (calc) 287  eGFR (African American) >60  eGFR (Non-African American) >60 (eGFR values <87mL/min/1.73 m2 may be an indication of chronic kidney disease (CKD). Calculated eGFR is useful in patients with stable renal function. The eGFR calculation will not be reliable in acutely ill patients when serum creatinine is changing rapidly. It is not useful in  patients on dialysis. The eGFR calculation may not be applicable to patients at the low and high extremes of body sizes, pregnant women, and vegetarians.)  Cardiac:  21-May-14 05:17   CK, Total 60  CPK-MB, Serum  8.6 (Result(s) reported  on 30 May 2012 at [facility].)  Troponin I  5.40 (0.00-0.05 0.05 ng/mL or less: NEGATIVE  Repeat testing in 3-6 hrs  if clinically indicated. >0.05 ng/mL: POTENTIAL  MYOCARDIAL INJURY. Repeat  testing in 3-6 hrs if  clinically indicated. NOTE: An increase or decrease  of 30% or more on serial  testing suggests a  clinically important change)  Routine Hem:  21-May-14 05:17   WBC (CBC) 7.9  RBC (CBC)  2.86  Hemoglobin (CBC)  8.5  Hematocrit (CBC)  24.3  Platelet Count (CBC)  138  MCV 85  MCH 29.8  MCHC 35.1  RDW  22.6  Neutrophil % 84.7  Lymphocyte % 7.0  Monocyte % 7.5  Eosinophil % 0.3  Basophil % 0.5  Neutrophil #  6.7  Lymphocyte #  0.6  Monocyte # 0.6  Eosinophil # 0.0  Basophil # 0.0 (Result(s) reported on 30 May 2012 at 05:38AM.)   Assessment/Plan:  Assessment/Plan:  Assessment Recurrent GI bleed likely secondary to colonic & small bowel AVMS: Stable.  Needs endoscopic intervention.  Discussed risks/benefits w/ pt & niece.  All in agreement. Anemia: s/p transfusion, stable.  Await recommendations from hematology.   Plan 1) Clear liquids, NPO after MN 2) Standard prep for colonoscopy tomorrow w/ Dr Allen Norris 3) DC protonix gtt 4) Protonix $RemoveBef'40mg'[id number]$  IV daily   Electronic Signatures: Andria Meuse (NP)  (Signed 21-May-14 14:29)  Authored: Chief Complaint, VITAL SIGNS/ANCILLARY NOTES, Brief Assessment, Lab Results,  Assessment/Plan   Last Updated: 21-May-14 14:29 by Andria Meuse (NP)

## 2014-05-02 NOTE — Consult Note (Signed)
PATIENT NAME:  [name] [name], [name] [name] MR#:  [id number] DATE OF BIRTH:  07-Sep-1918  DATE OF CONSULTATION:  05/12/2012  REFERRING PHYSICIAN:      Auburn BilberryShreyang Patel, MD CONSULTING PHYSICIAN:  Marcina MillardAlexander Jeny Nield, MD  PRIMARY CARE PHYSICIAN:  Bethann PunchesMark Miller, MD  CHIEF COMPLAINT: Shortness of breath.   REASON FOR CONSULTATION: Consultation requested for evaluation of congestive heart failure.   HISTORY OF PRESENT ILLNESS: The patient is a 81 year old female with history of prior congestive heart failure as well as marked severe aortic stenosis. The patient was recently hospitalized 04/25/2012 for a diagnosis of congestive heart failure, was diuresed and sent home. The patient returned on 05/10/2012 with lower extremity edema, abdominal distention and shortness of breath with 2-pillow orthopnea consistent with recurrent congestive heart failure. Admission labs were notable for markedly elevated [name] of 56,559, troponin was borderline elevated at 0.25 the absence of chest pain. The patient has undergone diuresis with clinical improvement.   PAST MEDICAL HISTORY: 1.  History of congestive heart failure, recent episode of acute on chronic diastolic heart failure. 2.  Marked severe aortic stenosis [place] [facility], not felt to be a candidate for surgical or percutaneous valve replacement surgery. 3.  History of mitral valve replacement.  4.  Chronic atrial fibrillation.  5.  History of GI bleed secondary to colonic angiodysplasia.  6.  History of severe anemia requiring transfusions. 7.  History of Raynaud's phenomenon.   MEDICATIONS ON ADMISSION:  Torsemide 20 mg daily. Metolazone 2.5 mg daily. Metoprolol tartrate 25 mg daily. Advair 100/50, 1 puff [name].i.d.  Calcium plus vitamin D 1 tab daily. Norco 5/325 q.6 p.r.n. Potassium 10 mEq daily. Senna Plus 1 tabs t.i.d.   SOCIAL HISTORY: [facility] She lives in independent living with  a caregiver. She denies tobacco abuse.   FAMILY HISTORY: No immediate family history for myocardial infarction.    REVIEW OF SYSTEMS:   CONSTITUTIONAL: No fever or chills.  EYES: No blurry vision.  EARS: No hearing loss.  RESPIRATORY:  The patient does have shortness of breath as described above.  CARDIOVASCULAR: The patient denies chest pain. She does have pedal edema and orthopnea.  GASTROINTESTINAL: No nausea, vomiting, diarrhea or constipation.  GENITOURINARY: No dysuria or hematuria.  ENDOCRINE: No polyuria or polydipsia.  HEMATOLOGICAL: No easy bruising or bleeding.  INTEGUMENTARY: No rash.  MUSCULOSKELETAL: No arthralgias or myalgias.  NEUROLOGICAL: No focal muscle weakness or numbness.  PSYCHIATRIC: No depression or anxiety.   PHYSICAL EXAMINATION: VITAL SIGNS: Blood pressure 110/47, pulse 64, respirations 17, pulse oximetry 96%.  HEENT: Pupils equal, reactive to light and accommodation.  NECK: Supple without thyromegaly.  LUNGS: Decreased breath sounds in both bases.  HEART: Normal JVP. Normal PMI. Regular rate and rhythm. Normal S1, S2. Grade 3/6 crescendo decrescendo murmur.  ABDOMEN: Soft and nontender. Pulses were intact bilaterally.  MUSCULOSKELETAL: Normal muscle tone. NEUROLOGIC: The patient is alert and oriented x 3. Motor and sensory both grossly intact.   IMPRESSION: This is a 81 year old female with reported history of moderate to severe aortic stenosis [place] [facility], not felt to be a candidate for surgical or percutaneous valve replacement surgery. The patient presents with pedal edema, shortness of breath consistent with congestive heart failure due to acute on chronic diastolic heart failure. The patient has diuresed with clinical improvement. The patient has borderline elevated troponin likely due to demand supply ischemia.   RECOMMENDATIONS: 1.  Agree with overall current therapy.  2.  Would defer full dose anticoagulation  3.   Would defer chronic anticoagulation for atrial fibrillation in light of the patient's history of GI bleed.  4.  Continue diuresis.  5.  May transfer to telemetry. 6.  No further cardiac evaluation at this time.    ____________________________ Marcina Millard, MD ap:ct D: 05/12/2012 10:05:41 ET T: 05/12/2012 11:34:56 ET JOB#: [id number]  cc: Marcina Millard, MD, <Dictator> Marcina Millard MD ELECTRONICALLY SIGNED 05/21/2012 13:47

## 2014-05-02 NOTE — Discharge Summary (Signed)
PATIENT NAME:  [name] SprangETREE, [name] [name] MR#:  [id number] DATE OF BIRTH:  1918/07/12  DATE OF ADMISSION:  05/10/2012 DATE OF DISCHARGE:  05/15/2012   DISCHARGE DIAGNOSES: 1.  Acute on chronic systolic congestive heart failure.  2.  Severe valvular heart disease.  3.  Chronic atrial fibrillation.  4.  History of gastrointestinal bleed secondary to colonic angiodysplasia.  5.  Thrombocytopenia.   DISCHARGE MEDICATIONS: 1.  Advair [name].i.d.  2.  Vitamin D3 daily.  3.  Metaxalone 2.5 mg every other day. 4.  Metoprolol tartrate 25 mg daily. 5. Norco 5/325, 1 tab q.6 p.r.n.  6.  Potassium chloride 10 mEq 4 daily.  7.  Senna plus daily.  8.  Torsemide 20 mg daily.  9.  Ramipril 2.5 mg daily.   REASON FOR ADMISSION: The patient is a 81 year old female who presents with dyspnea. Please see and physical for history of present illness, past history and physical exam.   HOSPITAL COURSE: The patient was admitted, IV diuresed, potassium replaced. It was thought fairly strongly that her readmissions were due to her high salt [facility]. This discussed in detail. She will be getting a home health nurse's aide to help and that [facility] was app[facility] this.  Overall prognosis is poor with her severe anemic issues. We will follow up on her thrombocytopenia, anemia and renal function in one week. Overall prognosis is guarded.    ____________________________ Danella PentonMark F. Javell Blackburn, MD mfm:ct D: 05/15/2012 08:03:47 ET T: 05/15/2012 08:30:01 ET JOB#: [id number]  cc: Danella PentonMark F. Hunt Zajicek, MD, <Dictator> Keoni Risinger Sherlene ShamsF Mikiah Durall MD ELECTRONICALLY SIGNED 05/15/2012 17:31

## 2014-05-02 NOTE — H&P (Signed)
PATIENT NAME:  [name] [name], [name] [name] MR#:  [id number] DATE OF BIRTH:  [birth year]  DATE OF ADMISSION:  05/29/2012  PRIMARY CARE PROVIDER: Dr. Sabra Heck  ED REFERRING PHYSICIAN: Graciella Freer, MD      CHIEF COMPLAINT: Dark-colored stools as well as atrial fibrillation with rapid ventricular response, hypotension.   HISTORY OF PRESENT ILLNESS: The patient is a 81 year old white female who was recently hospitalized on 05/10/2012. At that time she had acute systolic CHF, who subsequently was discharged home after being in the hospital for 6 days. The patient also at that time was noted to have thrombocytopenia.  She currently resides at [facility] independent facility. The patient, during her previous hospitalization, was seen by Palliative Care, and now she is being followed by Hospice; however, she continues to be a FULL CODE. She is brought into the hospital because last week she had an episode of dark-colored stool. Today she had another 2 to 3 episodes of dark-colored stool. The patient also has had progressive weakness and has fallen a few times. The patient was brought to the ED and was noted to be in atrial fibrillation with RVR. Her blood pressure was noted to be in the 90s. Her hemoglobin has dropped from 9.3 to 6.6. She also is noted to have an elevated troponin. She reports that she just feels very weak, does not have any chest pains. She does complain of shortness of breath. Prior to her previous hospitalization, she was not on any oxygen, but she is now on 2 liters of continuous oxygenation. She does complain of abdominal pain.  She states that "it is in her colon." She is denying any nausea or any hematemesis. She denies any lower extremity swelling. She denies any problems with frequency, urgency or hesitation with urination.   PAST MEDICAL HISTORY: 1.  History of congestive heart failure. Last echo done during her recent hospitalization showed moderate-to-severe TR. She is noted to have a  moderate-to-severe aortic regurg, also has EF of 40 to 45%, also noticed to have infarct on her echo.  2.  History of chronic a-fib.  3.  History of GI bleeding secondary to colonic angiodysplasias in the past. She has had on and off bleeding over the past 11 years.  4.  History of severe anemia, has required transfusions in the past.  5.  History of thrombocytopenia noted during previous hospitalization, but her platelet count this time is normal.  5.  History of Raynaud's phenomenon.  6.  History of COPD.   PAST SURGICAL HISTORY:  1.  Status post hysterectomy.  2.  Status post right total hip replacement as well as left hip replacement.  3.  Status post mitral valve replacement x 3.  4.  Status post T11 kyphoplasty.   ALLERGIES: CODEINE, FOSAMAX AND SULFA.   CURRENT HOME MEDICATIONS: Advair 100/50, 1 puff b.i.d., calcium plus vitamin D 600 + 200, 1 tab p.o. daily, metolazone 2.5 daily, metoprolol tartrate 25 mg p.o. daily, Norco 5/325 q. 6 p.r.n. for pain, KCl 10 mEq 1 tab 4 times a day as needed, Ramipril 2.5 p.o. daily, Senna 1 tab p.o. 3 times a day as needed, torsemide 20, 1 tab p.o. daily, vitamin D3 1000 international units daily.   SOCIAL HISTORY: Does not smoke. No alcohol.  Her husband currently resides in a nursing home. She lives in an independent facility, now has Hospice following her,  still a FULL CODE.   REVIEW OF SYSTEMS:  CONSTITUTIONAL: Denies any fevers. Complains of  fatigue, weakness. Complains of abdominal pain. No weight loss. No weight gain.  EYES: No blurred or double vision. No pain. No redness. No inflammation. No glaucoma.  EARS, NOSE AND THROAT: No tinnitus. No ear pain. No hearing loss. No difficulty swallowing.  RESPIRATORY: Denies any cough, wheezing. No hemoptysis. Has a history of mild COPD.  CARDIOVASCULAR: Denies any chest pain. Complains of shortness of breath. Denies edema today. Has chronic atrial fibrillation. Denies any syncope.  GASTROINTESTINAL:  Complains of no nausea, vomiting or diarrhea. Complains of abdominal pain in the lower abdomen. No hematemesis. Complains of dark-colored stools.  GENITOURINARY: Denies any frequency, urgency or hesitancy.  ENDOCRINE: Denies any polydipsia, polyuria.  No thyroid problems.  HEMATOLOGIC/LYMPHATIC: Has a history of anemia.  Last hemoglobin on discharge was 9.3, today 6.6. Has a history of thrombocytopenia. Denies any easy bruisability or bleeding.  SKIN: No acne. No rash. No changes in mole, hair or skin.  MUSCULOSKELETAL: Denies any pain in the neck, back or shoulder.  NEUROLOGIC: No numbness. No CVA. No TIA.   PSYCHIATRIC: No anxiety. No insomnia. No ADD.   PHYSICAL EXAMINATION: VITAL SIGNS: Temperature 97.9, pulse 123, respirations 18, blood pressure was 98/68.  GENERAL: The patient is an elderly ill-looking female.  HEENT: Atraumatic, normocephalic. Pupils are equally round and reactive to light and accommodation. There is conjunctival pallor. No scleral icterus. Nasal exam shows no drainage or ulceration. Oropharynx: Clear without any exudates. Ears shows no drainage. No exudates.  NECK: No thyromegaly. No carotid bruits.  CARDIOVASCULAR: She has a systolic murmur at the left sternal border. PMI is not displaced. S1, S2 positive.  LUNGS: She has got no accessory muscle usage. Clear to auscultation bilaterally without any rales, rhonchi, wheezing.  ABDOMEN: Positive bowel sounds x 4. There is no guarding. No rebound. No hepatosplenomegaly.  GENITOURINARY: Deferred.  EXTREMITIES: She has no clubbing, cyanosis or edema.  SKIN: No rash.  MUSCULOSKELETAL: There is no erythema or swelling.  LYMPHATICS: No lymph nodes palpable.  VASCULAR: Good DP, PT pulses.  PSYCHIATRIC: Not anxious or depressed.  NEUROLOGIC: Awake, alert, oriented x 3. No focal deficits.   LABORATORY AND RADIOLOGICAL DATA:  Done in the ED showed:  BNP of 32,588, glucose 150, BUN 46, creatinine 0.77, sodium 134, potassium is  3.9, chloride 102, CO2 is 26, calcium 9.6, total protein 7.2, albumin 3.0, bili total is 0.4, alk phos is 123, AST is 21, ALT 21. CPK is 24. CK-MB 2.8. Troponin 0.54. WBC 6.5, hemoglobin 6.6, platelet count 184, INR 1.0.   Chest x-ray shows appearance of chest is consistent with COPD.   ASSESSMENT AND PLAN: The patient is a 81 year old with history of CHF, severe valvular disease, anemia, presents with weakness, multiple falls, blood in the stool.   1.  Hematochezia:  Likely due to possible colonic dysplasias, a possible upper GI bleed due to gastric ulcer, duodenal ulcer, gastritis. At this time, will transfuse her 2 units of packed RBCs in the setting of a-fib with RVR, hypotension. The patient has consented by the ED physician for transfusion. She is agreeable, the risk and benefits explained. The patient also will have a GI consult for further evaluation of her symptoms. I will follow her hemoglobin and hematocrit, transfuse below hemoglobin of 8.  2.  Atrial fibrillation with rapid ventricular response:  Due to low blood pressure, unable to give IV Cardizem. At this time, we will give her an IV dose of Digoxin.  If heart rate continues to be still elevated, we  will start her on amiodarone drip. We will get Cardiology evaluation.  3.  Acute on chronic anemia:  We will transfuse as written above.  4.  Hypotension:  At this time, I will start her on normal saline at 100 mL an hour.  With her history of CHF, we will monitor her respiratory status. We will hold all her antihypertensives including metoprolol and the ACE inhibitor that she is on. Multivalvular heart disease which is severe. Prognosis is poor.  5.  Elevated troponin, possibly due to demand ischemia:  Had an echo recently.  Also could be due to ischemia. Follow cardiac enzymes.  Unable to give her aspirin or metoprolol due to her low blood pressure. Cardiology will evaluate the patient.  She is not a candidate for any type of intervention.    CODE STATUS: I discussed with the patient. She again wants FULL CODE. She was seen by Palliative Care during recent hospitalization. She is at a very high risk of cardiopulmonary arrest. In light of her multiple medical problems, including hypotension, a-fib with RVR, acute anemia, the patient is a critically ill.   TIME SPENT:   Note 50 minutes of critical care time spent on the patient.   ____________________________ Lafonda Mosses. Posey Pronto, MD shp:cb D: 05/29/2012 14:22:56 ET T: 05/29/2012 15:07:03 ET JOB#: [id number]  cc: Shreyang H. Posey Pronto, MD, <Dictator> Alric Seton MD ELECTRONICALLY SIGNED 06/07/2012 14:48

## 2014-05-02 NOTE — H&P (Signed)
PATIENT NAME:  [name] [name], [name] [name] MR#:  [id number] DATE OF BIRTH:  Mar 29, 1918  DATE OF ADMISSION:  05/10/2012  PRIMARY CARE PROVIDER:  Dr. Hyacinth MeekerMiller.  EMERGENCY DEPARTMENT REFERRING PHYSICIAN:  Dr. Manson PasseyBrown.   REASON FOR ADMISSION:  Shortness of breath.   HISTORY OF PRESENT ILLNESS:  The patient is a 81 year old white female with a known history of CHF secondary to moderate to severe aortic stenosis, who last was hospitalized on 04/16. At that time was diagnosed with CHF and was treated with diuresis and discharged home. She returns back to the ED complaining of shortness of breath, lower extremity swelling, abdominal distention. The patient reports that normally she uses 2 pillows, and now has to be propped up to sleep. She also has had progressive dyspnea on exertion. She is chronically short of breath. She otherwise denies any chest pains, palpitations or syncope. No abdominal pain, nausea, vomiting or diarrhea.   PAST MEDICAL HISTORY: 1.  History of congestive heart failure secondary to valvular heart disease. Last echo was done in January 2012 here that showed a normal EF, but moderate to severe aortic stenosis. She has been [facility] by cardiologist, who has told her that she is likely not a candidate for any type of valvular intervention.  2.  History of chronic A. fib.  3.  History of GI bleed secondary to colonic angiodysplasia.  4.  History of severe anemia. Has required transfusions in the past.  5.  History of Raynaud's phenomenon.   PAST SURGICAL HISTORY:  Status post hysterectomy, status post right total hip replacement, as well as left hip replacement, status post mitral valve replacement x 3, status post T11 kyphoplasty.   ALLERGIES:  CODEINE, FOSAMAX AND SULFA.   CURRENT MEDICATIONS: She is on Advair 100/50, 1 puff [name].i.d., calcium plus vitamin D 1 tab p.o. daily, metolazone 2.5 daily, metoprolol tartrate 25, 1 tab p.o. daily, Norco 5/325, 1 tab p.o. q.6 p.r.n., KCl 10 mEq  daily, Senna Plus 1 tab p.o. 3 times a day as needed, torsemide 20 daily, vitamin D3, 1000 International Units daily.   SOCIAL HISTORY:  Does not smoke. No alcohol. Husband currently resides in a nursing home. She lives in an independent living facility with caregiver.   REVIEW OF SYSTEMS:  CONSTITUTIONAL:  Denies any fevers. Complains of fatigue, weakness. No pain. No weight loss, no weight gain.  EYES:  No blurred or double vision. No pain. No redness. No inflammation. No glaucoma.  EARS, NOSE, THROAT:  No tinnitus. No ear pain. No hearing loss. No difficulty swallowing.  RESPIRATORY:  Denies any cough, wheezing. No hemoptysis. No COPD.  CARDIOVASCULAR:  Denies any chest pain. Complains of orthopnea, edema. Has history of atrial fibrillation. Denies any syncope.  GASTROINTESTINAL:  No nausea, vomiting, diarrhea. No abdominal pain. No hematemesis. Complains of abdominal distention. States that had bowel movements earlier this morning.  GENITOURINARY:  Denies any frequency, urgency or hesitancy.  ENDOCRINE:  Denies any polyuria, nocturia or thyroid problems.  HEMATOLOGIC/LYMPHATIC:  Has chronic anemia. Denies easy bruisability or bleeding.  SKIN:  No acne. No rash. No changes in mole, hair or skin.  MUSCULOSKELETAL:  Denies any pain in the neck, back or shoulder.  NEUROLOGIC:  No numbness. No CVA. No TIA.  PSYCHIATRIC:  No anxiety. No insomnia. No ADD.   PHYSICAL EXAMINATION: VITAL SIGNS:  Temperature 97.7, pulse 62, respirations 20, blood pressure 130/60, O2 100% on room air.  GENERAL:  The patient is an elderly female in no  acute distress.  HEENT: Head atraumatic, normocephalic. Pupils equally round, reactive to light and accommodation. There is no conjunctival pallor. No scleral icterus. Nasal exam shows no drainage or ulceration.  Oropharynx is clear without any exudate.  EARS: no draiange no lesions NECK:  No thyromegaly. No carotid bruits.  CARDIOVASCULAR:  She has a systolic harsh  murmur at the left sternal border. PMI is not displaced.  LUNGS:  She has got bilateral crackles.  ABDOMEN: There is some abdominal distention. Positive bowel sounds x 4. There is no guarding, no rebound. no hepato/spenomegaly GU: deffered EXTREMITIES:  She has got 2+ edema.  SKIN:  No rash.  LYMPHATICS:  No lymph nodes palpable.  VASCULAR:  Good DP, PT pulses.  PSYCHIATRIC:  Not anxious or depressed.  NEUROLOGIC:  Awake, alert, oriented x 3. No focal deficits.   DIAGNOSTIC AND LABORATORY DATA:  EKG shows ventricular paced rhythm.  Glucose 156, BUN 36, creatinine 0.95, sodium 136, potassium 4.5, chloride 107, CO2 is 24, calcium 8.8. CPK is 40, CK-MB 3.9, troponin 0.29. WBC 9.8, hemoglobin 9.8, platelet count 99.   ASSESSMENT AND PLAN: The patient is a 81 year old white female with history of congestive heart failure, based on her echo is diastolic congestive heart failure. Also has moderate severe aortic stenosis and anemia, presents with shortness of breath, abdominal swelling.  1.  Shortness of breath, likely due to acute diastolic congestive heart failure. At this time, we will treat her with IV Lasix. Continue her metolazone.  2.  Abdominal swelling, likely possibly due to congestive heart failure. We will try Lasix. The patient currently having BMs, so unlikely small bowel obstruction. We will do p.r.n. stool softeners.  3.  Anemia, which is chronic, appears to be stable.  4.  Thrombocytopenia, which is a new diagnosis for the patient. We will follow her platelets. Will need outpatient heme evaluation.  5.  Elevated cardiac enzymes. Could be related to her valvular disease and CHF.  We will follow her cardiac enzymes. We will ask cardiology to follow the patient. In light of thrombocytopenia, we will hold aspirin for the time being.   CODE STATUS:  Discussed with the patient. She wishes full code. Consider palliative care consult to assist with code status.   TIME SPENT: Note 45 minutes  spent.      ____________________________ Lacie Scotts. Allena Katz, MD shp:dmm D: 05/10/2012 20:26:48 ET T: 05/10/2012 20:46:13 ET JOB#: [id number]  cc: Mamoru Takeshita H. Allena Katz, MD, <Dictator> Charise Carwin MD ELECTRONICALLY SIGNED 05/19/2012 14:39
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
## 2014-05-02 NOTE — Discharge Summary (Signed)
PATIENT NAME:  [name] SprangETREE, [name] [name] MR#:  [id number] DATE OF BIRTH:  08-Oct-1918  DATE OF ADMISSION:  04/25/2012 DATE OF DISCHARGE:  04/30/2012  DISCHARGE DIAGNOSES: 1.  Right lower lobe pneumonia.  2.  Acute on chronic congestive heart failure secondary to valvular heart disease.  3.  Chronic atrial fibrillation. 4.  History of gastrointestinal bleed secondary to colonic angiodysplasia.  5.  Chronic severe anemia.  6.  Reynaud's disease.   DISCHARGE MEDICATIONS: 1.  Torsemide 20 mg daily. 2.  Vitron-C 1 daily.  3.  Potassium chloride 10 milliequivalents 4 tabs daily.  4.  Norco 5/325 mg t.i.d. p.r.n. pain. 5.  Metaxalone 2.5 mg every other day. 6.  Advair 100/50 mg [name].i.d. 7.  Augmentin 500 mg [name].i.d.  8.  Prednisone taper.   REASON FOR ADMISSION: A 81 year old female who presents with right lobe pneumonia, respiratory failure. Please see H and P for HPI, past medical history and physical exam.   HOSPITAL COURSE: The patient was admitted. She was diuresed and improved. She was given IV antibiotics in the form of Rocephin. She was given nebulizer treatments and IV steroids. Her wheezing and congestion all improved. Her oxygenation improved. She was still having a little bit of wheezing at time of discharge. Overall her main problem was right lower lobe pneumonia, which improved with IV antibiotics, nebulizers, steroids and cough       suppression. Overall prognosis is guarded with her other multiple problems. Hemoglobin stable at 8.5. Will be discharged for follow-up with Dr. Hyacinth MeekerMiller on Friday.  ____________________________ [name] PentonMark F. Min Collymore, MD mfm:sb D: 04/30/2012 07:13:48 ET T: 04/30/2012 08:34:27 ET JOB#: [id number]  cc: [name] PentonMark F. Kaniyah Lisby, MD, <Dictator> [name] Levengood Sherlene ShamsF Jaculin Rasmus MD ELECTRONICALLY SIGNED 05/01/2012 7:56
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
## 2014-05-02 NOTE — Consult Note (Signed)
Brief Consult Note: Diagnosis: recurrent GI bleed.   Patient was seen by consultant.   Consult note dictated.   Comments: Ms. [name] [name] is a pleasant 81 y/o female with recurrent GI bleed & anemia.  She has hx of right colonic & distal small bowel AVMs requiring multiple APC treatments over the past several years.  She is currently receiving 2 units PRBCs.  Will consult hematology for their input regarding continuing parenteral iron.  Consider colonoscopy on 5/22. I have discussed this with Dr Servando SnareWOHL.  Plan: 1) Agree w/ transfusions to keep hgb around 9 grams if possible 2) recommend hematology consult to resume IV Fe 3) agree w/ PPI 4) colonoscopy 5/22 with Dr Servando SnareWohl if pt agrees #[id number].  Electronic Signatures: Joselyn ArrowJones, Pixie Burgener L (NP)  (Signed 20-May-14 21:32)  Authored: Brief Consult Note   Last Updated: 20-May-14 21:32 by Joselyn ArrowJones, Zakkiyya Barno L (NP)

## 2014-05-02 NOTE — Consult Note (Signed)
PATIENT NAME:  [name] [name], [name] [name] MR#:  [id number] DATE OF BIRTH:  Mar 20, 1918  DATE OF CONSULTATION:  01/18/2012  CONSULTING PHYSICIAN:  Lupita Dawn. Narmeen Kerper, MD  REASON FOR REFERRAL: Symptomatic anemia, recurrent GI bleeding.   HISTORY OF PRESENT ILLNESS: The patient is a 81 year old white female with a known history of iron deficiency anemia and recurrent GI bleeding who had a bone marrow biopsy in November which was essentially negative. She has been on iron IV along with red blood cell transfusions to keep the hemoglobin up. When she was brought in, her hemoglobin had gone down to 5.1. She feels tired, weak and unable to even get up and ambulate. She gets short of breath but denies having any chest pain or palpitations. She has seen some bright red blood per rectum.   The patient has had multiple colonoscopies this past year including January, May and December. Each time, right-sided AVMs were seen and there were cauterized. She had an upper endoscopy done in May which was completely negative. After the last colonoscopy, she had a video capsule study done in December which showed possible AVMs in the distal small intestine.   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 Other history includes Raynaud's phenomena, subarachnoid hemorrhage.  SURGICAL HISTORY: Includes mitral valve placement, hysterectomy, hip surgery, appendectomy, wrist surgery and pacemaker placement.  FAMILY HISTORY: Negative.   SOCIAL HISTORY: Negative for tobacco or alcohol use.   HOME MEDICATIONS: Include potassium, magnesium, calcium, Senna, torsemide, vitamins and cetirizine at bedtime.   ALLERGIES: She is allergic to DEMEROL, SULFA, CODEINE AND FOSAMAX.  REVIEW OF SYMPTOMS: Again, there are no fevers or chills but significant weakness and fatigue. There are no visual or hearing changes. There is no  chest pain or palpitations, but she does complain of dyspnea on exertion. There is no coughing. GI history: There is no nausea, vomiting or abdominal pain. There is no diarrhea or constipation, but she does see some blood per rectum. Usually her stools are dark because she is on chronic iron. The rest of the review of symptoms is negative.   PHYSICAL EXAMINATION:  GENERAL: She appeared weak, but much better after some blood transfusions. VITAL SIGNS: Stable. She is afebrile.  HEAD AND NECK: Normocephalic, atraumatic head. Pupils are equally reactive. Throat was clear. Neck was supple.  CARDIAC: Regular rhythm and rate without murmurs.  LUNGS: Clear bilaterally.  ABDOMEN: Normoactive bowel sounds, soft. There is no hepatomegaly. She had active bowel sounds.  EXTREMITIES: No clubbing, cyanosis or edema.  SKIN: Negative.  NEUROLOGIC: Examination was nonfocal.   LABORATORY DATA: In terms of labs, initially her hemoglobin was 5.1, it went up to 8.1 after 2 units of blood this morning, it is 7.1. White count is normal at 3.8. Magnesium level is 2.6, potassium level was 2.9 on admission, now is 3.8. Iron saturation is only 4%. Iron level is low at 16.   ASSESSMENT AND PLAN: This is a patient with chronic iron deficiency anemia with evidence of gastrointestinal bleeding with recurrent drop in hemoglobin that is (Dictation Anomaly) symptomatic. The patient has had multiple endoscopies including 3 colonoscopies in the past year. She could be bleeding from recurrent AVMs, but at this time she really does not want to go through another colonoscopy at her age since she went through 3 already, the last being less than a month ago. She could also  be bleeding from distal small intestinal AVMs which unfortunately will not be able to reach with any scopes to cauterized. There is no need to repeat the upper endoscopy because upper endoscopy and stomach looked normal on video capsule study. Therefore, we are somewhat  limited as to control the bleeding endoscopically. I would transfuse the patient as needed. Will continue the iron infusions and hopefully the hemoglobin will stabilized.   Thank you for the referral.     ____________________________ Lupita Dawn. Candace Cruise, MD pyo:es D: 01/18/2012 78:47:84 ET T: 01/18/2012 12:56:47 ET JOB#: [id number]  cc: Lupita Dawn. Candace Cruise, MD, <Dictator> Lupita Dawn Amjad Fikes MD ELECTRONICALLY SIGNED 01/19/2012 8:04

## 2014-05-02 NOTE — Consult Note (Signed)
PATIENT NAME:  [name] [name], [name] [name] MR#:  [id number] DATE OF BIRTH:  November 07, 1918  DATE OF CONSULTATION:  05/29/2012  REFERRING PHYSICIAN:  Auburn BilberryShreyang Patel, MD  CONSULTING PHYSICIAN:  Marcina MillardAlexander Saniya Tranchina, MD  CHIEF COMPLAINT: "I am weak."  REASON FOR CONSULTATION: Requested for evaluation of atrial fibrillation.   HISTORY OF PRESENT ILLNESS: The patient is a 81 year old female referred for evaluation for atrial fibrillation with rapid ventricular rate. The patient reports she was in her usual state of health until earlier today when she experienced bright red blood in her stool.  Caretaker noted that she was profoundly weak and was brought [facility] Room where she was found to be markedly anemic with a presenting hemoglobin and hematocrit of 6.6 and 20.6, respectively. Other notable admission labs included a BUN and creatinine of 46 and 0.77, respectively. Troponin was 0.54. The patient denies chest pain. EKG revealed atrial fibrillation with a rapid ventricular rate.   PAST MEDICAL HISTORY: 1.  Chronic atrial fibrillation.  2.  Chronic anemia.  3.  History of GI bleed due to colonic angiodysplasia.  4.  Status post mitral valve replacement with a porcine bioprosthetic valve.  5.  History of Raynaud's phenomena.  6.  History of subarachnoid hemorrhage. 7.  Aortic stenosis.   MEDICATIONS ON ADMISSION: Torsemide 30 mg daily, potassium 60 mEq daily, metolazone 2.5 mg every other day p.r.n., metoprolol  25 mg daily, vitamin D3, 2000 units daily,  Senna capsule 8.6 mg daily, Calcium 500+ D 1 tablet t.i.d., meclizine 12.5 mg t.i.d. p.r.n.,  Advair Diskus 250/50, 1 puff [name].i.d.   SOCIAL HISTORY: The patient is a retired Engineer, siteschool teacher. She currently is a resident of [place] [place].   FAMILY HISTORY: Positive for coronary artery disease.   REVIEW OF SYSTEMS:   CONSTITUTIONAL: No fever or chills.  EYES: No blurry vision.  EARS: No hearing loss.  RESPIRATORY: No shortness of breath.   CARDIOVASCULAR: No chest pain.  GASTROINTESTINAL: The patient did have nausea, vomiting, and bright red blood in her stool.  GENITOURINARY: No dysuria or hematuria.  ENDOCRINE: No polyuria or polydipsia.  HEMATOLOGICAL: No easy bruising. She did have marked anemia as noted above.  INTEGUMENTARY: No rash.  NEUROLOGICAL: No focal muscle weakness or numbness.  PSYCHOLOGICAL: No depression or anxiety.   PHYSICAL EXAMINATION: VITAL SIGNS: Blood pressure 102/58, pulse 110 to 120 irregular, irregular, respirations 20, temperature 98.6, pulse oximetry 99%.  HEENT: Pupils equal and reactive to light and accommodation.  NECK: Supple without thyromegaly.  LUNGS: Clear.  CARDIOVASCULAR: Normal JVP. Normal PMI. Tachycardia. Irregular, irregular rhythm. Normal S1, S2. Grade 1 out of 6 systolic murmur.  ABDOMEN: Soft and nontender. Pulses were intact bilaterally.  MUSCULOSKELETAL: Normal muscle tone.  NEUROLOGICAL: The patient is alert and oriented x 3. Motor and sensory are both grossly intact.   IMPRESSION: A 81 year old female who presents with marked anemia and gastrointestinal bleed with atrial fibrillation with a rapid ventricular response secondary to marked anemia.   RECOMMENDATIONS: 1.  Resume metoprolol when the patient is able to take p.o. medications.  2.  Agree with transfusion, which will help with tachycardia.  3.  If the patient has symptomatic tachycardia, may repeat intravenous Digoxin load. Consider 0.125 mg IV q. 2 hours x 3 doses.  4.  If the patient experiences breakthrough tachycardia despite intravenous Digoxin, then would consider IV Lopressor 2.5 to 5 mg IV q. 6 hours p.r.n.   5.  Further recommendations pending patient's initial clinical course.   ____________________________ Lyn HollingsheadAlexander  Diannie Willner, MD ap:cb D: 05/29/2012 16:28:08 ET T: 05/29/2012 17:14:01 ET JOB#: [id number]  cc: Marcina Millard, MD, <Dictator> Marcina Millard MD ELECTRONICALLY SIGNED 06/01/2012  8:24

## 2014-05-02 NOTE — H&P (Signed)
PATIENT NAME:  [name] [name], Mollee B MR#:  [id number] DATE OF BIRTH:  [birth year]  DATE OF ADMISSION:  04/25/2012  HISTORY OF PRESENT ILLNESS:  The patient is a 81 year old female presenting with progressive respiratory failure. I have seen her in the office 3 to 4 times now and she has progressively gotten worse despite aggressive outpatient therapy. Her symptoms have been progressive shortness of breath, orthopnea, PND, wheezing, coughing up yellowish-greenish sputum. She has been given multiple rounds of the steroids and Ceftin, Z-Pak. Her overall fluid status is stable. She notes no chest pain. She has gotten progressively lethargic with of decreased mentation. She has not been sleeping well. She has really nobody look after her home, overall failing at home despite being seen in the office at least once a week if not twice.  With her progressive decline in respiratory status, lethargy and altered mental status, she will be admitted for further evaluation and treatment.   PAST MEDICAL HISTORY AND MEDICAL ILLNESSES: 1.  Congestive heart failure secondary to valvular heart disease. 2.  Chronic atrial fibrillation.  3.  History of gastrointestinal bleed secondary to colonic angiodysplasia.  4.  Chronic severe anemia.  5.  Renaud's .   PAST SURGICAL HISTORY: 1.  Hysterectomy.  2.  Right total hip.  3.  Mitral valve replacement x3. 4.  T11 kyphoplasty.   ALLERGIES: CODEINE, FOSAMAX AND SULFA.   MEDICATIONS:  Vitamin D3 2000 units daily, torsemide 20 mg 1-1/2 daily, Vitron-C  1 daily, potassium chloride 10 mEq 6 tabs daily, Norco 5/325 t.i.d. p.r.n. pain,  metaxalone 2.5 mg every other day, metoprolol tartrate 25 mg daily, Advair 250/50 sinus b.i.d.   SOCIAL HISTORY: Married. No smoking or alcohol. Husband currently in a nursing home.   FAMILY HISTORY: Significant for arteriosclerotic cardiovascular disease.   REVIEW OF SYSTEMS: As above, otherwise negative.   PHYSICAL EXAMINATION: VITAL SIGNS:  Blood pressure 100/56, pulse 55 irregular, pulse oximetry 90% on room air, weight 97.  NECK: 2+ JVD.  HEENT: Benign.  LUNGS: Severe inspiratory/expiratory wheezing, very poor air movement. No clear consolidation.  HEART: Irregular rhythm, 3/6 systolic murmur throughout the precordium.  ABDOMEN: Soft and nontender. Hepatojugular reflux present.  EXTREMITIES: 3+ edema bilaterally. Diminished peripheral pulses.  NEUROLOGIC: Grossly nonfocal, although the patient generally lethargic, generally weak diffusely.   LABORATORY DATA:  Pending.   ASSESSMENT AND PLAN: 1.  Respiratory failure-consistent  mostly with an infectious etiology with reactive airway disease with likely a component of acute on chronic systolic congestive heart failure. We will give her IV Lasix IV antibiotics. Check a chest x-ray, low dose nebulizers, steroids.  2. Valvular congestive heart failure-acute on chronic congestive heart failure, IV Lasix. Follow renal function closely.  3.  Severe anemia- followed by hematology. She each gets transfusions fairly routinely. She has known gastrointestinal bleeding from angiodysplasia.   PROGNOSIS:  Overall prognosis is guarded    ____________________________ Danella PentonMark F. Miller, MD mfm:cc D: 04/25/2012 18:09:44 ET T: 04/25/2012 18:34:31 ET JOB#: [id number]  cc: Danella PentonMark F. Miller, MD, <Dictator> MARK Sherlene ShamsF MILLER MD ELECTRONICALLY SIGNED 04/26/2012 8:27

## 2014-05-04 NOTE — Discharge Summary (Signed)
PATIENT NAME:  [name] SprangETREE, Yarnell B MR#:  [id number] DATE OF BIRTH:  1918/09/14  DATE OF ADMISSION:  03/08/2011 DATE OF DISCHARGE:  03/11/2011  ADMITTING DIAGNOSIS: T11 compression fracture.   DISCHARGE DIAGNOSES: T11 compression fracture.  OPERATION: On 03/10/2011, the patient had a T11 kyphoplasty with biopsy. Surgeon was Dr. Gerrit Heckaliff. Anesthesia was MAC. Estimated blood loss was minimal. Specimen was tissue from T11. Drains: None. Complications: None. The patient was stabilized, brought to the recovery room, and then brought down to the Orthopedic floor.   HISTORY: The patient is a 81 year old female who presented to the [facility] after falling and having significant back pain. The patient had a CT scan revealing a T11 compression fracture. There was questionable edema within the T12 region. The patient was having severe pain and could not tolerate narcotics. She could not take care of herself and was admitted to the hospital for upcoming kyphoplasty after medical clearance based on her significant anemia.   PHYSICAL EXAMINATION: GENERAL: Alert female with difficulty with any type of transfers, sitting in a wheelchair. CARDIAC: Normal. LUNGS: Clear. MUSCULOSKELETAL: In regard to the thoracic spine, the patient has kyphosis with significant tenderness along the lower thoracic region. The patient has muscle guarding as well. The patient has limited tenderness along the lumbosacral junction. Her neurovascular exam involving the lower extremities is benign.   HOSPITAL COURSE: After initial admission on 03/08/2011, the patient was brought to the Orthopedic floor for medical clearance. The patient was seen by [facility] and treated for anemia. The patient did have a positive Hemoccult and has had a recent colonoscopy. The patient was treated for hypertension. The patient did receive a transfusion initially before surgery. The patient on admission had a hemoglobin of 7.6, received 1 unit of transfused blood,  and hemoglobin the next morning was at 9.1, 9.2 the following day, then 8.8 the day after surgery with no other transfusions given. The patient was stable, was feeling better after surgery, and was to do physical therapy before going home.   CONDITION AT DISCHARGE: Stable.  DISPOSITION: The patient was sent home.   DISCHARGE INSTRUCTIONS:  1. The patient will follow up with [facility] in one week. The patient does have an elbow fracture which has been benign, and she will have x-rays at that time as well as a dressing change in a week.  2. The patient will call the Clinic if there is any bright red bleeding around the incision or any significant calf pain or fever greater than 101.5.  3. The patient will not do any physical therapy at home.  4. The patient will keep her dressing on and is able to shower after about three days. She can remove her splint on her arm as well as change Band-Aids after showering.  5. The patient will resume a regular diet.  6. The patient will use her regular medications at home and will begin taking Os-Cal with vitamin D 500 mg, 1 tablet with each meal, and Vicodin 1 to 2 tablets every 4 hours as needed for severe pain.   ____________________________ Shela CommonsJ. Dedra Skeensodd Rori Goar, [place] jtm:cbb D: 03/11/2011 07:34:18 ET T: 03/11/2011 13:05:34 ET JOB#: [id number]  cc: J. Dedra Skeensodd Christos Mixson, [place], <Dictator> J River Mckercher [facility] Hospital[facility]UNDY PA ELECTRONICALLY SIGNED 03/12/2011 14:07

## 2014-05-04 NOTE — Consult Note (Signed)
PATIENT NAME:  [name] [name], Any B MR#:  [id number] DATE OF BIRTH:  04-14-1918  DATE OF CONSULTATION:  02/05/2011  REFERRING PHYSICIAN:   CONSULTING PHYSICIAN:  Ezzard StandingPaul Y. Bluford Kaufmannh, MD  REASON FOR REFERRAL:  Rectal bleeding.   DESCRIPTION:  The patient is a 81 year old white female with a history of atrial fibrillation, hyperlipidemia, and hypertension who was seen by Vevelyn Pathristiane London in the GI office yesterday morning for gastrointestinal evaluation. The patient has been complaining of gross rectal bleeding for the past two weeks or so. This is without any abdominal pain or cramping. In fact, her hemoglobin two weeks ago was 7.5 and required 2 units of blood transfusion. When she was in the GI office she had gross blood per rectum. Therefore, the decision was made to a directly admit the patient for further evaluation.   The patient recalls [facility] a similar episode [facility] few years [facility] when she had rectal bleeding. She required multiple blood transfusions. She recalls having a colonoscopy then and thought that something was cauterized. Perhaps it was an angiodysplasia. She also has a history of diverticulosis as well.   The patient denies any shortness of breath or chest pains at this time.   PAST MEDICAL HISTORY:  1. Chronic atrial fibrillation.  2. History of  congestive heart failure secondary to aortic stenosis and pulmonary hypertension.  3. History of stroke and also paresis.    PAST SURGICAL HISTORY: 1. Multiple mitral valve replacements with porcine valve. This was done three different times.   2. Hysterectomy.  3. Right total hip.   ALLERGIES: She is allergic to codeine, sulfa, and Fosamax.   MEDICATIONS AT HOME: Torsemide, metolazone, magnesium, Protonix, MiraLAX, inhalers, Norco, metoprolol, and potassium.   SOCIAL HISTORY: She denies smoking and alcohol.  FAMILY HISTORY: There is no family history.  REVIEW OF SYSTEMS:  No fevers or chills. There is no coughing or shortness of  breath at rest. There are no chest pains or palpitations at this time. GI symptoms: No nausea, vomiting, indigestion, or heartburn. There is no abdominal pain or cramping. The bleeding can be gross blood that is either bright blood or maroon or sometimes even dark.   PHYSICAL EXAMINATION: VITAL SIGNS: This morning temperature 98, pulse 64, respirations 18, blood pressure 125/66, and pulse oximetry 93.   HEENT: Normocephalic, atraumatic head. Pupils are equally reactive. Throat was clear.   NECK: Supple.   CARDIAC: Examination showed a large systolic murmur. It was somewhat irregular to me.   LUNGS: Lungs showed some crackles at the bases.   ABDOMEN: Normoactive bowel sounds, soft. It was nontender. There was no hepatomegaly. There are no palpable masses.   EXTREMITIES: Bilateral edema.   LABORATORY, DIAGNOSTIC, AND RADIOLOGICAL DATA: Sodium 140, potassium 4.2, chloride 100, CO2 29, BUN 34, creatinine 0.91, glucose 90. Liver enzymes were normal except for alkaline phosphatase at 165. Hemoglobin on admission was 10. White count 5.8, platelet count 135, PT and PTT were normal.   ASSESSMENT AND PLAN: This is a patient with a pretty significant cardiac history who now has had persistent rectal bleeding for the past two weeks. We need to monitor her hemoglobin closely. If her hemoglobin drops below 8, I will recommend some blood transfusions slowly so she does not develop congestive heart failure. If the bleeding continues, then I will switch her to a clear liquid diet tomorrow and give her a bowel prep tomorrow evening. We will plan on doing a colonoscopy on Monday afternoon with prophylactic antibiotics unless  the bleeding stops completely on its own.  I suspect the bleeding is coming from either angiodysplasias or diverticula. Thank you for the referral.     ____________________________ Ezzard Standing. Bluford Kaufmann, MD pyo:bjt D: 02/05/2011 10:23:22 ET T: 02/05/2011 11:23:28 ET JOB#: [id number]  cc: Ezzard Standing. Bluford Kaufmann,  MD, <Dictator> Ezzard Standing Haruo Stepanek MD ELECTRONICALLY SIGNED 02/08/2011 8:53

## 2014-05-04 NOTE — Consult Note (Signed)
PATIENT NAME:  [name] [name], [name] MR#:  [id number] DATE OF BIRTH:  November 25, 1918  DATE OF CONSULTATION:  03/09/2011  REFERRING PHYSICIAN:  Ruthann CancerJames Califf, MD  CONSULTING PHYSICIAN:  Lurline DelShaukat Becci Batty, MD  REASON FOR CONSULTATION: Severe anemia.   HISTORY OF PRESENT ILLNESS: This is a 81 year old Caucasian female who fell recently at home. A CT scan showed T1 compression fracture. The patient was recently admitted as it was not possible to manage her pain as outpatient. In addition to being in severe discomfort, she was unable to take care of herself at home. Kyphoplasty has been planned, although the patient was found to be severely anemic with a hemoglobin of around 7.5. The patient has been transfused 2 units of packed RBCs and her hemoglobin is now 9.1. The patient denies any melena, bright red blood per rectum, nausea, vomiting, abdominal pain, diarrhea, or constipation. According to the patient, she has been found to be anemic in the recent past and has undergone a colonoscopy by Dr. Bluford Kaufmannh last month. That colonoscopy report was reviewed. Apparently several AVMs were noted in the ascending colon and argon plasma coagulation was used. The patient apparently had an attempt on a video capsule [facility] ago but that was unsuccessful according to the patient. The patient has also had an upper GI endoscopy but that was also some time ago and [facility]. As mentioned above, she denies any signs of active bleeding and she denies any significant GI symptoms except for chronic distention of her abdomen according to her.   PAST MEDICAL HISTORY:  1. History of anemia and chronic AVMs as mentioned above.  2. History of aortic stenosis. 3. Pulmonary hypertension. 4. Congestive heart failure. 5. Chronic atrial fibrillation. 6. History of prosthetic mitral valve replacement with porcine valve. 7. History of rheumatic fever as child. 8. Constipation. 9. Raynaud's phenomenon.   10. Osteoporosis.  11. History of subarachnoid hemorrhage.    PAST SURGICAL HISTORY:  1. Hysterectomy.  2. Mitral valve surgery.  3. Appendectomy.  4. Hip surgery. 5. Wrist surgery.   MEDICATIONS AT HOME:  1. Vitamin D.  2. Norco.  3. Advair. 4. MiraLAX. 5. Magnesium. 6. Xopenex. 7. Vitamin D3. 8. Senna.  9. Torsemide. 10. Ferrous sulfate.  11. Potassium chloride.  12. Metoprolol. 13. Protonix.   REVIEW OF SYSTEMS: Positive for back pain and generalized weakness.   FAMILY HISTORY: Unremarkable.   PHYSICAL EXAMINATION:   GENERAL: Elderly, cachectic pale appearing female, does not appear to be in any acute distress, quite awake and alert.   VITAL SIGNS: Temperature 98.3, pulse 60, respirations 18, blood pressure 105/55.   NECK: Neck veins are flat.   LUNGS: Grossly clear to auscultation.   CARDIOVASCULAR: Regular rate and rhythm.   ABDOMEN: Globular shaped abdomen due to abdominal distention. Abdomen is tympanitic to percussion. There is no significant rebound or tenderness. The patient has had multiple abdominal surgeries in the past.   EXTREMITIES: No edema. No clubbing or cyanosis.   NEUROLOGIC: Appears to be unremarkable.   LABORATORY, DIAGNOSTIC, AND RADIOLOGICAL DATA: Her white cell count is normal at 4.6. Hemoglobin was 7.6 on admission with a platelet count of 144. Hemoglobin is now 9.1. Electrolytes, BUN, and creatinine are fairly unremarkable. Stool for occult blood is positive x1 out of 2.   ASSESSMENT AND PLAN: The patient is with a history of chronic anemia, recent diagnosis of chronic AVMs status post argon plasma coagulation. The patient is now presenting with worsening  anemia which is probably secondary to chronic GI blood loss. The patient has history of aortic stenosis which is often associated with a small bowel as well as colonic AVMs and I suspect that she is probably slowly losing blood from AVMs in other parts of the GI tract such as small  bowel as well as colon and stomach. There are no signs of active GI bleeding. The patient has fracture at T11 and will require kyphoplasty. As there are no signs of active GI bleeding and her hemoglobin and hematocrit is stable, I believe we can proceed with surgery and further GI work-up including an upper endoscopy plus/minus video capsule endoscopy can be done after surgery. Avoid anticoagulants and antiplatelet agents as much as possible. Agree with the rest of the current management including use of PPI. Message has been left for Dr. Gerrit Heck and I will discuss with him. Further recommendations to follow. Plan has been discussed with the patient as well and she is in full agreement.   ____________________________ Lurline Del, MD si:drc D: 03/09/2011 17:59:59 ET T: 03/10/2011 07:56:20 ET JOB#: [id number]  cc: Lurline Del, MD, <Dictator> [place]. Gerrit Heck, MD Lurline Del MD ELECTRONICALLY SIGNED 03/12/2011 15:00

## 2014-05-04 NOTE — H&P (Signed)
PATIENT NAME:  [name], Shunte B MR#:  [id number] DATE OF BIRTH:  1918-05-08  DATE OF ADMISSION:  02/21/2011  REFERRING PHYSICIAN: Dr. Chiquita LothJade Sung.  PRIMARY CARE PHYSICIAN:  Dr. Bethann PunchesMark Miller.   REASON FOR ADMISSION: Bright red blood per rectum, constipation x2 weeks, elevated troponin.   HISTORY OF PRESENT ILLNESS: 81 year old white female with past medical history of osteoporosis, history of subarachnoid hemorrhage, chronic atrial fibrillation, congestive heart failure, aortic stenosis, pulmonary hypertension, recent admission, discharged with colonic angiodysplasia, who presents with bright red blood per rectum constipation x2 weeks. The patient, since her discharge, has not passed any bowel movement. She said she has had some blood leaking out of her rectum and on her undergarments. She has been trying to take milk of magnesia as per her primary care physician's recommendation, but has not been able to do this. She broke her arm two weeks ago. She is not sure which bone and was [facility] to [facility] and has this immobilized. She says she has been having difficulty moving and has lost her appetite as she has been feeling nauseous. She had nausea and vomiting the last two days. She said she had two soapsuds enemas and passed a bowel movement that was black and tarry today. In the Emergency Room, she received potassium and she was severely potassium depleted. In addition, the patient received IV fluids. She has elevated troponin. We are asked to admit the patient for gastrointestinal bleed, constipation, elevated troponin.   PAST MEDICAL/SURGICAL HISTORY:  1. Osteoporosis. 2. History of subarachnoid hemorrhage.  3. Chronic atrial fibrillation. 4. Congestive heart failure secondary to aortic stenosis and pulmonary hypertension. 5. Colonic angiodysplasia. 6. Recent right upper extremity fracture.   PAST SURGICAL HISTORY:  1. Hysterectomy. 2. Right total hip. 3. Mitral valve replacement in 1976, 1990  and 2004.   MEDICATIONS AT HOME:  1. Advair Diskus 250/50, one puff b.i.d.  2. Albuterol 2 puffs every four hours p.r.n.  3. Protonix 40 milligrams twice daily.  4. Mag Ox 400 milligrams daily.  5. Norco 5/325, one-half tablet twice daily p.r.n.  6. MiraLAX 17 grams daily. 7. Iron sulfate 325 mg daily.  8. Potassium chloride 10 mEq 4 tablets daily. 9. Metoprolol tartrate 25 mg daily.  10. Torsemide 20 mg 1/2 tablet daily.  11. Metolazone 0.5 mg every day.   DRUG ALLERGIES: Codeine, sulfa.   SOCIAL HISTORY: She is married, has three adult children who are healthy. She does not smoke, drink, or use illicit drugs.   FAMILY HISTORY: Mother died at age 79 of cerebrovascular accident. Father died of myocardial infarction at age 79.   REVIEW OF SYSTEMS: She has been feeling fatigued, weak. No fever. EYES: No blurred vision, double vision, pain, redness or glaucoma. ENT: No tinnitus or ear pain. She does have hearing loss. No seasonal allergies, epistaxis or discharge. RESPIRATORY: No cough, wheezing, hemoptysis. She has dyspnea on exertion. No painful respirations. CARDIOVASCULAR: No chest pain, orthopnea, edema, or arrhythmia. She has dyspnea on exertion. No palpitations. GASTROINTESTINAL: Nausea and vomiting. No diarrhea. She has no abdominal pain but she has constipation. No gastroesophageal reflux disease. She has bright red blood per rectum. GU: No dysuria, hematuria or incontinence. GYN: No breast mass, tenderness, vaginal discharge. She is postmenopausal. ENDOCRINE: No polyuria, nocturia, thyroid problems, heat or cold intolerance. HEME/LYMPH: No anemia, bruising, or swollen glands. INTEGUMENT: No acne, rash, change in mole, hair, or skin. MUSCULOSKELETAL: No pain in back, shoulder, knee, hip, arthritis. swelling, or gout. NEUROLOGIC: No numbness,  weakness, epilepsy, tremor, vertigo or ataxia. PSYCH: No anxiety, insomnia, ADD, bipolar, or depression.   PHYSICAL EXAMINATION:  VITAL SIGNS:  Temperature 96.7, heart rate 55, respirations 18, blood pressure 120/54, sating 98 percent on room air.   GENERAL: The patient is well developed, well nourished, looks younger than her stated age.   HEENT: Pupils equal, reactive to light and accommodation. Extraocular movements are intact. Anicteric sclerae. No difficulty hearing. Dry mucous membranes. No thyromegaly, no lymphadenopathy. No carotid bruits.   LUNGS: Clear to auscultation. No adventitious breath sounds. Resonant to percussion. No use of accessory muscles. No increased effort.   CARDIOVASCULAR: She currently has atrial fibrillation, rate controlled. Aortic stenosis murmur which radiates to the neck, heard best at the left sternal border. PMI is not lateralized. Chest is not tender. 2+ dorsalis pedis pulses. No lower extremity edema.   BREASTS: Deferred.   ABDOMEN: Soft, nontender, nondistended, hypoactive bowel sounds.   GU: Deferred.   MUSCULOSKELETAL: Globally muscle, 4/5. No cyanosis, jugular venous distention or kyphosis.   SKIN: She does have a fracture on the right arm which is wrapped.   SKIN: No rashes, lesions, or induration.   LYMPH: No lymphadenopathy in the cervical or supraclavicular area.   NEUROLOGIC: Cranial nerves II-XII are intact. Sensation is intact. No dysarthria or aphasia or contractures.   PSYCH: Alert and oriented x3 with good judgment.   LABORATORY, DIAGNOSTIC, AND RADIOLOGICAL DATA: Glucose 95, BUN 41, creatinine 0.88, sodium 133, potassium 2.5, chloride 89, bicarbonate 33, total protein 7.7, albumin 3.7, total bilirubin 0.6, alkaline phosphatase 139, AST 26, ALT 17, troponin 0.10. White count 6.4, hemoglobin 10.6, hematocrit 31.6, platelets 193, MCV 92. EKG shows electronically paced. Abdomen 3-way shows findings representing underlying component of pulmonary fibrosis. Nonobstructed bowel gas pattern.   ASSESSMENT AND PLAN: This is a pleasant 81 year old white female with history of valvular  heart disease, atrial fibrillation, hyperlipidemia, hypertension, who had recent admission for colonic angiodysplasia who presents with constipation, bright red per rectum and melena.  1. Gastrointestinal bleed with constipation. The patient had colonic angiodysplasia. Will continue Protonix as she was on at home twice a day. Most likely her gastrointestinal bleeding is from straining from her constipation. Her constipation could have come from dehydration. We will hold her diuretics for now because of that. In addition, she is on iron and she has not been very active because of her fracture of her right arm. Will give bowel prep in the form of lactulose and Colace. She has already passed a bowel movement. Will monitor.  2. Nausea and vomiting. Give p.r.n. Phenergan. 3. Elevated troponin. We will monitor. If the patient's troponin rises, we will give aspirin. We will try to avoid it since she is having gastrointestinal bleeding and will continue metoprolol. The patient does have aortic stenosis and is on diuretics. We will hold this as she seems to be dehydrated as she has dry mucous membranes.  4. Atrial fibrillation, rate controlled. Will continue metoprolol.  5. Valvular heart disease, congestive heart failure. She currently looks dry, so we will hold her diuretics. 6. Anemia. We will monitor and do serial hemoglobins, type and screen.  7. Severe hypokalemia. The patient has received IV potassium. Will check magnesium and replete orally.  8. Hyponatremia. This could be from dehydration. We will give IV fluids and repeat BMP.  9. Deep venous thrombosis prophylaxis. Maintain with TED stockings and SCDs.  10. CODE STATUS: Full code.   We will sign out the patient later to [facility] today to Dr. Bethann Punches.  TOTAL TIME SPENT ON ADMISSION: 55 minutes.   ____________________________ Corie Chiquito Lafayette Dragon, MD aaf:ap D: 02/21/2011 10:22:19 ET               T: 02/21/2011 10:50:04 ET                  JOB#: [id number] cc: Danella Penton, MD Ezzard Standing. Bluford Kaufmann, MD Coralyn Pear MD ELECTRONICALLY SIGNED 02/21/2011 22:21

## 2014-05-04 NOTE — Discharge Summary (Signed)
PATIENT NAME:  [name] [name], [name] [name] MR#:  [id number] DATE OF BIRTH:  28-Feb-1918  DATE OF ADMISSION:  02/04/2011 DATE OF DISCHARGE:  02/08/2011  DISCHARGE DIAGNOSES:  1. Gastrointestinal bleed secondary to colonic angiodysplasias.  2. Congestive heart failure, due to aortic stenosis and pulmonary hypertension.  3. Chronic atrial fibrillation.   DISCHARGE MEDICATIONS:  1. Advair 250/50 twice a day.  2. Albuterol 2 puffs every 4 hours p.r.n.  3. Protonix 40 mg twice a day. 4. Magnesium oxide 400 mg daily. 5. Norco 5/325 mg 1/2 to 1 twice a day p.r.n. pain.  6. MiraLax 17 grams daily.  7. Iron sulfate 325 mg twice a day. 8. Potassium chloride 10 mEq 4 tabs daily.  9. Metoprolol tartrate 25 mg daily.  10. Torsemide 20 mg 1-1/2 daily.  11. Metolazone 2.5 mg every other day.   REASON FOR ADMISSION: This is a 81 year old female who presents with GI bleed and congestive heart failure. Please see history and physical for history of present illness, past medical history, and physical exam.   HOSPITAL COURSE: The patient was admitted and underwent colonoscopy showing angiodysplasias of the ascending colon which were cauterized by Dr. Bluford Kaufmannh. Hemoglobin remained reasonably stable. She was diuresed with IV Lasix and diuresed approximately 4 liters. She will         be on the medicine, as noted above, hopefully to keep her stable. She did not need oxygen. Followup on Friday.  PROGNOSIS: Good. ____________________________ Danella PentonMark F. Aldwin Micalizzi, MD mfm:slb D: 02/08/2011 08:10:22 ET T: 02/08/2011 13:07:29 ET JOB#: [id number]  cc: Danella PentonMark F. Devery Odwyer, MD, <Dictator> Rena Hunke Sherlene ShamsF [name] Dibiasio MD ELECTRONICALLY SIGNED 02/11/2011 8:11

## 2014-05-04 NOTE — Consult Note (Signed)
Chief Complaint:   Subjective/Chief Complaint anemia, lower GI bleed. Noted 2.3g drop in hgb o/n. Patient denies bloody stools, abdominal pain, acid reflux, heartburn indigestion.Black stool noted yesterday.  Does state she took some Benadryl last night and still feels a little sleepy. No further c/o.   VITAL SIGNS/ANCILLARY NOTES: **Vital Signs.:   01-May-13 04:57   Vital Signs Type Routine   Temperature Temperature (F) 98.1   Celsius 36.7   Temperature Source oral   Pulse Pulse 60   Pulse source per Dinamap   Respirations Respirations 18   Systolic BP Systolic BP 95   Diastolic BP (mmHg) Diastolic BP (mmHg) 52   Mean BP 66   BP Source Dinamap   Pulse Ox % Pulse Ox % 94   Pulse Ox Activity Level  At rest   Oxygen Delivery Room Air/ 21 %  *Intake and Output.:   Shift 01-May-13 15:00   Grand Totals Intake:   Output:  100    Net:  -100 24 Hr.:  -100   Urine ml     Out:  100   Length of Stay Totals Intake:  1800 Output:  3000    Net:  -1200   Brief Assessment:   Cardiac Regular  murmur present  -- thrills  -- LE edema  -- JVD  --Gallop    Respiratory normal resp effort  clear BS    Gastrointestinal details normal Soft  Nontender  Nondistended  No masses palpable  Bowel sounds normal  No rebound tenderness  No gaurding  No rigidity  No organomegaly    Additional Physical Exam Alert, oriented x 3. CN II-XII intact, speech clear, no facial droop     Routine Hem:  01-May-13 06:21    WBC (CBC) 3.6   RBC (CBC) 2.81   Hemoglobin (CBC) 8.8   Hematocrit (CBC) 26.7   Platelet Count (CBC) 114   MCV 95   MCH 31.4   MCHC 33.0   RDW 16.0   Neutrophil % 49.8   Lymphocyte % 32.0   Monocyte % 13.8   Eosinophil % 3.1   Basophil % 1.3   Neutrophil # 1.8   Lymphocyte # 1.2   Monocyte # 0.5   Eosinophil # 0.1   Basophil # 0.0  Routine Chem:  01-May-13 06:21    Glucose, Serum 77   BUN 17   Creatinine (comp) 0.90   Sodium, Serum 141   Potassium, Serum 3.7   Chloride,  Serum 109   CO2, Serum 25   Calcium (Total), Serum 8.2   Osmolality (calc) 282   eGFR (African American) >60   eGFR (Non-African American) 56   Anion Gap 7   Assessment/Plan:  Assessment/Plan:   Assessment 1. Drop in hemoglobin over night. Discussed patient with Dr Candace Cruise.  With history of black stool, weight loss, and dysphagia, will plan for EGD today.  Discussed indications, risks/benefits with patient including, but not limitied to bleeding, infection, perforation, difficulty with sedation. She is agreeable. Possibly will need colonoscopy tomorrow.   Electronic Signatures: Stephens November H (NP)  (Signed 01-May-13 08:55)  Authored: Chief Complaint, VITAL SIGNS/ANCILLARY NOTES, Brief Assessment, Lab Results, Assessment/Plan   Last Updated: 01-May-13 08:55 by Theodore Demark (NP)

## 2014-05-04 NOTE — Op Note (Signed)
PATIENT NAME:  [name] [name], Emmerie B MR#:  [id number] DATE OF BIRTH:  Nov 03, 1918  DATE OF PROCEDURE:  03/10/2011  PREOPERATIVE DIAGNOSIS: T11 compression fracture with persistent pain.   POSTOPERATIVE DIAGNOSIS: T11 compression fracture with persistent pain.   PROCEDURES:  1. T11 kyphoplasty with biopsy and injection of PMMA cement.  2. Radiologic interpretation fluoroscopically placed trocar for kyphoplasty and biopsy of T11.   SURGEON: Babetta Paterson C. Umaiza Matusik, MD  ASSISTANT: None.   ANESTHESIA: MAC.   ESTIMATED BLOOD LOSS: Minimal.   COMPLICATIONS: None.   BRIEF CLINICAL NOTE AND PATHOLOGY: The patient had severe back pain with documented compression fracture. She had difficulty tolerating narcotics. Was unable to ambulate. Work-up showed evidence of T11 compression fracture. Options, risks, and benefits were discussed and she elected to proceed with kyphoplasty. At time of the procedure good bone specimen was obtained. The T11 vertebral body was expanded somewhat with the balloons.   DESCRIPTION OF PROCEDURE: Preop antibiotics, adequate MAC anesthesia, prone position, all prominences well padded. Routine prepping and draping. Appropriate timeout was called.   AP and lateral fluoroscopic views were obtained and the T11 vertebral body was identified, pedicles were appropriately aligned. It was then instrumented in routine fashion bilaterally by making a small skin incision, advancing the trocars through the pedicles into the vertebral bodies using fluoroscopic guidance. Biopsies were obtained, balloons were inserted and sequentially inflated.   Cement was then mixed and was injected when it was of the appropriate consistency filling the vertebral bodies nicely.   The balloons were removed, cement was injected. There was no significant compressive extravasation. The cannulas were cleaned and removed. The incisions were closed with Monocryl. Band-Aids were applied. The patient was awakened, taken to  the postanesthesia care unit having tolerated procedure well.   ____________________________ Winn JockJames C. Gerrit Heckaliff, MD jcc:cms D: 03/13/2011 20:16:04 ET T: 03/14/2011 09:15:49 ET JOB#: [id number]  cc: Winn JockJames C. Gerrit Heckaliff, MD, <Dictator> Winn JockJAMES C Jessie Schrieber MD ELECTRONICALLY SIGNED 03/15/2011 7:40
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
## 2014-05-04 NOTE — Consult Note (Signed)
Chief Complaint:   Subjective/Chief Complaint No specific complaints. Not sure if she had further bleeding or not. No signif drop in hgb.   VITAL SIGNS/ANCILLARY NOTES: **Vital Signs.:   27-Jan-13 05:25   Vital Signs Type Routine   Temperature Temperature (F) 97.3   Celsius 36.2   Temperature Source oral   Pulse Pulse 55   Pulse source per Dinamap   Respirations Respirations 18   Systolic BP Systolic BP 98   Diastolic BP (mmHg) Diastolic BP (mmHg) 62   Mean BP 74   BP Source Dinamap   Pulse Ox % Pulse Ox % 96   Pulse Ox Activity Level  At rest   Oxygen Delivery Room Air/ 21 %   Brief Assessment:   Cardiac Regular    Respiratory clear BS    Gastrointestinal Normal   Routine Chem:  27-Jan-13 04:32    Glucose, Serum 106   BUN 35   Creatinine (comp) 0.90   Sodium, Serum 140   Potassium, Serum 4.0   Chloride, Serum 101   CO2, Serum 28   Calcium (Total), Serum 8.9   Osmolality (calc) 288   eGFR (African American) >60   eGFR (Non-African American) >60   Anion Gap 11  Routine Hem:  27-Jan-13 04:32    Hemoglobin (CBC) 9.1   Assessment/Plan:  Assessment/Plan:   Assessment LGI bleeding.    Plan Will bowel prep today for colonoscopy tomorrow afternoon. Thanks   Electronic Signatures: Verdie Shire (MD)  (Signed 27-Jan-13 08:23)  Authored: Chief Complaint, VITAL SIGNS/ANCILLARY NOTES, Brief Assessment, Lab Results, Assessment/Plan   Last Updated: 27-Jan-13 08:23 by Verdie Shire (MD)

## 2014-05-04 NOTE — Consult Note (Signed)
Covering for Dr. Marva PandaSkulskie. EGD completely normal. Plan colonoscopy tomorrow afternoon with bowel prep tonight. Hx of colon AVM's. Thanks  Electronic Signatures: Lutricia Feilh, Conlan Miceli (MD)  (Signed on 01-May-13 15:18)  Authored  Last Updated: 01-May-13 15:18 by Lutricia Feilh, Misbah Hornaday (MD)

## 2014-05-04 NOTE — Consult Note (Signed)
Brief Consult Note: Diagnosis: Chronic anemia.   Patient was seen by consultant.   Consult note dictated.   Comments: Ptaient with chronic anemia. Colonoscopy last months showed AVM's in ascending colon which were treated with APC. Patient with low hemoglobin on admission which has come up appropriately after transfusion. No signs of active bleeding. I suspect AVM in other parts of GI tract such as small bowel are responsible for her anemia. As there are no signs of active bleeding and her H and H is better, may proceed with surgery and further GI workup can be done after surgery or even as OP. Message has been left for Dr. Gerrit Heckaliff and will discuss with him. Thanks.  Electronic Signatures: Lurline DelIftikhar, Yovani Cogburn (MD)  (Signed [phone]-Feb-13 17:53)  Authored: Brief Consult Note   Last Updated: 27-Feb-13 17:53 by Lurline DelIftikhar, Kareli Hossain (MD)

## 2014-05-04 NOTE — Discharge Summary (Signed)
PATIENT NAME:  [name] [name], [name] [name] MR#:  [id number] DATE OF BIRTH:  04-09-1918  DATE OF ADMISSION:  02/21/2011 DATE OF DISCHARGE:  02/22/2011  DISCHARGE DIAGNOSES:  1. Hypokalemia.  2. Constipation.  3. Gastrointestinal bleed, secondary to colonic angiodysplasias with iron deficiency anemia.  4. Chronic atrial fibrillation.  5. Congestive heart failure secondary to aortic stenosis and pulmonary hypertension.   DISCHARGE MEDICATIONS:  1. Advair 250/50 [name].i.d.  2. Protonix 40 mg [name].i.d.  3. Magnesium oxide 400 mg daily.  4. Potassium chloride 10 mEq q.i.d.  5. Metoprolol tartrate 25 mg daily. 6. Torsemide 20 mg 1-1/2 daily.  7. Cetirizine 10 mg daily.  8. Senna 2 daily.  9. Lidoderm patch topical daily to her back. 10. Norco 5/325, 0.5 [name].i.d. p.r.n. pain. 11. Metizoline 2.5 mg twice weekly.   REASON FOR ADMISSION: 81 year old female presents with hypokalemia and constipation. Please see history and physical for history of present illness, past medical history, physical exam.   HOSPITAL COURSE: Patient was admitted. Her potassium of 2.5 was IV and p.o. replaced as discussed actually was 5.3 on discharge. She will increase her potassium at home. She will remain on iron. Dulcolax suppositories were given and her bowels moved well. Constipation likely related to the iron supplementation. Lidoderm patches were started for her low back pain and they worked fairly well. She will follow up with Dr. Hyacinth MeekerMiller in one week. ____________________________ Danella PentonMark F. Miller, MD mfm:cms D: 02/23/2011 08:54:03 ET T: 02/23/2011 13:03:16 ET  JOB#: [id number] MARK F MILLER MD ELECTRONICALLY SIGNED 02/26/2011 10:30

## 2014-05-04 NOTE — Consult Note (Signed)
VITAL SIGNS/ANCILLARY NOTES: **Vital Signs.:   30-Apr-13 08:19   Vital Signs Type 1 hr Post Blood   Temperature Temperature (F) 97.5   Celsius 36.3   Temperature Source oral   Pulse Pulse 60   Pulse source per Dinamap   Respirations Respirations 20   Systolic BP Systolic BP 105   Diastolic BP (mmHg) Diastolic BP (mmHg) 64   Mean BP 77   BP Source Dinamap   Pulse Ox % Pulse Ox % 98   Oxygen Delivery Room Air/ 21 %   Brief Assessment:   Cardiac Regular  murmur present  -- LE edema  -- JVD  --Rub  --Gallop    Respiratory normal resp effort  clear BS    Gastrointestinal Normal   Routine Hem:  30-Apr-13 10:12    WBC (CBC) 4.2   RBC (CBC) 3.47   Hemoglobin (CBC) 11.1   Hematocrit (CBC) 33.6   Platelet Count (CBC) 136   MCV 97   MCH 31.9   MCHC 32.9   RDW 16.1   Neutrophil % 52.3   Lymphocyte % 31.2   Monocyte % 11.7   Eosinophil % 2.7   Basophil % 2.1   Neutrophil # 2.2   Lymphocyte # 1.3   Monocyte # 0.5   Eosinophil # 0.1   Basophil # 0.1   Assessment/Plan:  Assessment/Plan:   Assessment 1. Lower GI bleeding: appears to be resolving. Please refer to brief consult note earlier   Electronic Signatures: Kathryne HitchLondon, Jarrid Lienhard H (NP)  (Signed 30-Apr-13 13:01)  Authored: VITAL SIGNS/ANCILLARY NOTES, Brief Assessment, Lab Results, Assessment/Plan   Last Updated: 30-Apr-13 13:01 by Keturah BarreLondon, Roben Schliep H (NP)

## 2014-05-04 NOTE — H&P (Signed)
PATIENT NAME:  [name] [name], Ghada B MR#:  [id number] DATE OF BIRTH:  1918-02-24  DATE OF ADMISSION:  03/08/2011  CHIEF COMPLAINT: 81 year old female with severe back pain.   HISTORY OF PRESENT ILLNESS: The patient fell on 02/11/2011 and suffered a back injury with persistent pain which became progressive. She had a work-up which included a CT scan, which showed T11 compression fracture. Her pain continued to progress and a bone scan was performed which showed increased activity at T12, compatible with an acute compression fracture.   She called the office after office hours yesterday in severe pain, stating that the narcotic medication was not working at the doses that she could tolerate. She was having severe back pain. She could barely manage taking care of herself. It was elected to let her stay home overnight and bring her in the hospital to plan for kyphoplasty today. Upon admission to the hospital it was found that her hemoglobin was low and surgery was postponed. She was unsafe and unable to care for herself at home.   MEDICATIONS/ALLERGIES/ PAST MEDICAL HISTORY/ PAST SURGICAL HISTORY/  SOCIAL HISTORY/FAMILY HISTORY/ REVIEW OF SYSTEMS: Reviewed as per [facility] of 02/18. The questions were again asked and the answers were unchanged.   PHYSICAL EXAMINATION:  GENERAL: Elderly female, fairly comfortable now sitting up in the bed.   VITALS: Blood pressure 135/82, pulse 76 and regular, respirations 18.   HEENT: Pupils equal, round, reactive to light and accommodation. Extraocular movements intact.   NECK: Supple without bruits.   CARDIAC: Normal.   ABDOMEN: Benign.   UPPER EXTREMITIES: Good range of motion with minimal discomfort. There is a splint on the right forearm.   CERVICAL SPINE: Minimal muscle guarding. Overall good range of motion.   THORACIC SPINE: Moderate kyphosis with tenderness to percussion at the lower thoracic area. There is moderate muscle guarding.    LUMBOSACRAL SPINE:  Only mildly tender.   HIPS, KNEES, FEET, AND ANKLES: Overall have good range of motion. Straight leg raising  produces only low back pain. Neurovascular examination of the lower extremities showed slightly decreased sensation L5 distribution, otherwise unremarkable.   CLINICAL IMPRESSION:  1. Acute compression fracture T11 with progressive pain. The patient cannot manage at home, cannot be comfortable with the amount of narcotics that she can tolerate. Options, risks, and benefits have been discussed and she is ready to proceed with kyphoplasty once cleared medically.  2. Anemia.  3. Osteoporosis.  4. History of atrial fibrillation.  5. Prosthetic mitral valve. 6. History of cervical cancer.   PLAN: Medical clearance. Transfuse. Plan kyphoplasty of T11.    ____________________________ Winn JockJames C. Gerrit Heckaliff, MD jcc:bjt D: 03/08/2011 13:30:41 ET T: 03/08/2011 14:19:30 ET JOB#: [id number]  cc: Winn JockJames C. Gerrit Heckaliff, MD, <Dictator> Winn JockJAMES C Marquelle Musgrave MD ELECTRONICALLY SIGNED 03/08/2011 16:47

## 2014-05-04 NOTE — Consult Note (Signed)
PATIENT NAME:  [name] [name], [name] [name] MR#:  [id number] DATE OF BIRTH:  August 11, 1918  DATE OF CONSULTATION:  03/08/2011  REFERRING PHYSICIAN:  Dr. Gerrit [name]  CONSULTING PHYSICIAN:  [name] HammingSnehalatha Brach Birdsall, MD  PRIMARY CARE PHYSICIAN: Dr. Bethann PunchesMark [name]   REASON FOR CONSULTATION: Preop clearance.   HISTORY OF PRESENT ILLNESS: This is a 81 year old female with history of multiple medical problems of chronic atrial fibrillation status post pacemaker, chronic anemia, history of GI bleeds before and chronic obstructive pulmonary disease and history of osteoporosis had a fall three weeks ago and suffered a shoulder contusion and has splint for that and T11 compression fracture. Patient is scheduled for kyphoplasty of T11 this afternoon and I was asked to clear her. Patient came to the Emergency Room 02/15 which showed complete deformity of T11 and also she was sent home with pain medications and she called the office yesterday, said that pain medicines are not helping and giving significant side effects so she is here for kyphoplasty of T11 this afternoon. Patient denies chest pain. No trouble breathing. No blood loss from stools. Patient found to have blood pressure of 96/52 and also has anemia with hemoglobin 7.6, hematocrit 23.5.   PAST MEDICAL HISTORY:  1. History of GI bleed with colonic angiodysplasia; was here from 01/25 to 01/29 and had a colonoscopy and cauterized the angiodysplasia.  2. History of aortic stenosis.  3. Pulmonary hypertension. 4. Congestive heart failure. 5. Chronic atrial fibrillation status post pacemaker and prosthetic mitral vale placement with porcine type. 6. Rheumatic fever as a child.  7. Constipation.  8. History of Raynaud's phenomenon.  9. Osteoporosis. 10. History of subarachnoid hemorrhage.   ALLERGIES: Codeine, Demerol and sulfa.  PAST SURGICAL HISTORY:  1. Hysterectomy. 2. Mitral valve surgery.  3. Appendectomy.  4. Hip surgery on the left.  5. Wrist surgery on the left.    SOCIAL HISTORY: Married. No smoking, no alcohol. No drugs. Retired Runner, broadcasting/film/videoteacher.   FAMILY HISTORY: Significant for heart condition in father and mother and daughter died at the age of 79 with MI.   MEDICATIONS:  1. Vitamin D2 1 tablet daily.  2. Norco 5/325, 0.5 to 1 tablet every day [name].i.d. as needed. 3. Advair Diskus 250/50, 1 puff [name].i.d.  4. MiraLax 17 grams as needed.  5. Magnesium oxide 400 mg once a day. 6. Xopenex nebulizer every four hours as needed. 7. Vitamin D3. 8. Senna. 9. Torsemide 1-1/2 tablet once a day.  10. Ferrous sulfate 325 mg [name].i.d.  11. Metizoline 2.5 mg p.o. [name].i.d.  12. Miacalcin spray.  13. KCl 10 mEq p.o. Patient takes 4 tablets daily. 14. Metoprolol 25 mg 1 tablet daily.  15. Protonix 40 mg p.o. [name].i.d.   REVIEW OF SYSTEMS: CONSTITUTIONAL: Denies any fever. No fatigue. Has significant back pain. EYES: No blurred vision. ENT: No tinnitus. No epistaxis. No difficulty swallowing. RESPIRATORY: Has no cough, no trouble breathing. CARDIOVASCULAR: No chest pain. GASTROINTESTINAL: Had some nausea but no abdominal pain. GENITOURINARY: No dysuria. Patient denies any hematuria. ENDOCRINE: No polyuria, nocturia. HEMATOLOGIC: Has history of anemia and angiodysplasia of colon status post cauterization. INTEGUMENT: No skin rashes. MUSCULOSKELETAL: Has osteoporosis and joint pains. Has severe low back pain. NEUROLOGIC: No weakness. No dysarthria. PSYCH: No anxiety or insomnia.    PHYSICAL EXAMINATION:  VITAL SIGNS: Temperature 97.8, pulse 67, respirations 16, blood pressure 96/52, saturation 96% on room air.   GENERAL: She is a 81 year old female not in distress, answering questions appropriately.   HEENT: Head atraumatic, normocephalic. Pupils are  equally reacting to light. Extraocular movements are intact. ENT: No tympanic membrane congestion. No turbinate hypertrophy. No oropharyngeal erythema.   NECK: Normal range of motion. No JVD. No carotid bruits.   RESPIRATORY:  Bilaterally clear to auscultation. Not using accessory muscles of respiration.   CARDIOVASCULAR: S1, S2 regular. No murmurs.   ABDOMEN: Soft, nontender, nondistended. Bowel sounds present.   EXTREMITIES: Patient has a splint on the right elbow. No extremity edema.   SKIN: No skin rashes.   LYMPH NODES: No lymphadenopathy.   NEUROLOGICAL: Cranial nerves intact. No dysarthria. Deep tendon reflexes 2+. Sensations are intact.   PSYCHIATRIC: Oriented to time, place, person.   LABORATORY, DIAGNOSTIC AND RADIOLOGICAL DATA: WBC 4.6, hemoglobin 7.6, hematocrit 23.3, platelets 144. Electrolytes: Sodium 143, potassium 3.2, chloride 106, bicarbonate 26, BUN 19, creatinine 1.01, glucose 264. Hemoglobin 9.7 on 02/12 and hematocrit 28.4.  EKG showed electronic ventricular pacemaker with 59 beats per minute.   Chest x-ray is pending.    ASSESSMENT AND RECOMMENDATION: This is a 81 year old female with:  1. Compression fracture with back pain and she is going for kyphoplasty of T11. Patient is at moderate risk for surgery. Patient has acute on chronic anemia that needs to be fixed before the surgery. She needs blood transfusion with 2 units of packed RBC and check CBC again and also check stool for guaiac.  2. Patient had recent colonoscopy showing angiodysplasia in the ascending colon which were cauterized with argon laser coagulation. Right now her hemoglobin dropped again and we need to see if she is bleeding again or any other source of bleeding. Check the stool for guaiac and follow the hemoglobin after transfusion.  3. Hypokalemia. Patient's potassium will be replaced. 4. Hypertension. Patient has been on multiple medications for blood pressure including metoprolol, metozoione. Patient's medications will be on hold this time and continue IV hydration and check the blood pressure. hypotension likely due to comibination of pain meds and bp meds . Recommend holding the surgery until her blood pressure  improves and also her anemia is corrected.  discused with [name] [name] 5. GI prophylaxis on Protonix.  6. Patient has history of asthma. Nebulizers as needed.   TOTAL TIME SPENT ON PATIENT CONSULTATION: About 55 minutes.   Thank you for asking [place] to see this patient.   ____________________________ [name] Hamming, MD sk:cms D: 03/08/2011 11:05:35 ET T: 03/08/2011 12:26:23 ET JOB#: [id number]  cc: [name] Hamming, MD, <Dictator> Danella Penton, MD [name] Hamming MD ELECTRONICALLY SIGNED 03/09/2011 8:21

## 2014-05-04 NOTE — Consult Note (Signed)
Full consult to follow. Admitted from office yest. Rectal bleeding. Hx of diverticulosis and AVM's? Some bleeding overnight. No abd pain. Moniter hgb and transfuse as needed. Change diet to clears tomorrow. Bowel prep tomorrow afternoon. Plan for colonoscopy Monday afternoon unless bleeding stops completely. Will follow. Thanks  Electronic Signatures: Lutricia Feilh, Alaiyah Bollman (MD)  (Signed on 26-Jan-13 10:04)  Authored  Last Updated: 26-Jan-13 10:04 by Lutricia Feilh, Gaelle Adriance (MD)

## 2014-05-04 NOTE — Consult Note (Signed)
Brief Consult Note: Diagnosis: lower GI bleeding.   Patient was seen by consultant.   Consult note dictated.   Recommend further assessment or treatment.   Comments: Please see full GI consult to follow.  [name] [name] admitted from o/p PMD due to recurrent rectal bleeding. Patient has a history of subarachnoid hemorrhage, AF, aortic stenosis, tricuspid regurgitation with pulmonary htn, MV replacement 1976, 1990, 2004.  Also right THR, hysterectomy and kyphoplasty.  [name] [name]  with a history of chronic GI blood loss with transfusion dependance, [facility].   [name] [name] [facility] that [facility]. Patient had colonoscopy with cautery of right sided colonic avm's by Dr Bluford Kaufmannh in late Jan 3013 with hgb at that time of 7.2.  Patient with marked episode a week ago (about 10 days ago) with lesser bleeding since.  Last bm this am with 2 episodes of dark material.  No abdominal pain.  This earlier episode a week ago was brighter red in appearance.  Exam this evening showing no abdominal pain, rectal with old dried blood.  Currently to have tfx of 2 units of prbc, not on a ppi as o/p.  Will continue with current plan and reevaluate in am.  If there is evidence of recurrent new bleeding would to bleeding scan to help delineate location as she could have had a diverticular bleeding, versus recurrent avm.  Currently hemodynamically stable. Will follow with you.  Electronic Signatures: Barnetta ChapelSkulskie, Martin (MD)  (Signed 29-Apr-13 18:58)  Authored: Brief Consult Note   Last Updated: 29-Apr-13 18:58 by Barnetta ChapelSkulskie, Martin (MD)

## 2014-05-04 NOTE — Consult Note (Signed)
Brief Consult Note: Diagnosis: lower GI bleeding, anemia.   Comments: Patient feeling better today, steadier on feet: vss. CBC just back: hgb 11.1; wbc 4.2; platelets 136. Black BM today with some redness on toilet paper and slight red tinge in toilet bowel per nursing staff. Denies abdominal pain, fullness, NV, acid reflux. Is on Pantoprazole 40mg  poqd. Looks like dc plans for tomorrow: if no fresh bleeding will plan for o/p egd/colonoscopy.  Electronic Signatures: Kathryne HitchLondon, Christiane H (NP)  (Signed 30-Apr-13 10:45)  Authored: Brief Consult Note   Last Updated: 30-Apr-13 10:45 by Keturah BarreLondon, Christiane H (NP)

## 2014-05-04 NOTE — H&P (Signed)
PATIENT NAME:  [name] [name], Mlissa B MR#:  [id number] DATE OF BIRTH:  16-May-1918  DATE OF ADMISSION:  05/09/2011  HISTORY OF PRESENT ILLNESS: She is a 81 year old female who presents with acute lower gastrointestinal bleed. She has a long-standing history of colonic angiodysplasias with chronic GI bleeding. She was seen last week with bleeding which seemed to be getting better. Her hemoglobin had dropped from 10 to 8.5. Over the weekend she continued to bleed, she saw GI today. Hemoglobin had dropped down to 7.4, and she was just wiped both eyes. "I really couldn't get around," some transient chest pain. She will be admitted for transfusion and scoping to cauterize the angiodysplasias. Breathing otherwise is stable. Edema is controlled.   PAST MEDICAL HISTORY/MEDICAL ILLNESSES:  1. Atrial fibrillation. 2. Congestive heart failure secondary to aortic stenosis, tricuspid regurgitation and pulmonary hypertension.  3. Recurrent GI bleed secondary to colonic angiodysplasias.  4. History of subarachnoid hemorrhage.   PAST SURGICAL HISTORY:  1. Hysterectomy.  2. Right total hip replacement.  3. Mitral valve replacement x3. 4. T11 kyphoplasty in 2013.   ALLERGIES: Codeine, Fosamax, sulfa.   MEDICATIONS:  1. Norco 5/325, 1/2 to 1 b.i.d. p.r.n.  2. Advair 250/50 b.i.d.  3. MiraLax daily.  4. Torsemide at 20 mg, 1-1/2 daily.  5. Ferrous sulfate 325 mg b.i.d.  6. Metolazone 2.5 mg twice weekly as needed.  7. Potassium chloride 10 mEq, 4 daily.   8. Metoprolol tartrate 25 mg daily. 9. Calcium with vitamin D t.i.d.  SOCIAL HISTORY: Married. No smoking or alcohol.   FAMILY HISTORY: Noncontributory.   REVIEW OF SYSTEMS: Review of systems is as above, otherwise negative.   PHYSICAL EXAMINATION:  VITAL SIGNS: Blood pressure 135/70, pulse 70 and regular, weight 98.   HEENT: Benign.   NECK: 2+ JVD.   LUNGS: No crackles.   HEART: Irregular rate and rhythm, a 3/6 systolic murmur throughout the  precordium, particularly at the right upper sternal border.   ABDOMEN: Soft and essentially nontender, some mild distention.  EXTREMITIES: 2+ edema.   ASSESSMENT AND PLAN:  1. Lower GI bleed with associated significant and symptomatic anemia: I will transfuse with 2 units of packed red blood cells. GI scoping for cautery in light of her becoming transfusion-dependent. No anti-inflammatories taken.  2. Congestive heart failure: Again, related to AS and pulmonary hypertension. Lasix between units of blood. Continue diuretic. Consider metolazone. 3. Atrial fibrillation: Rate controlled.  4. Vertebral compression fracture: Post kyphoplasty  ____________________________ Danella PentonMark F. Nazario Russom, MD mfm:cbb D: 05/09/2011 16:21:01 ET T: 05/09/2011 17:22:51 ET JOB#: [id number]  cc: Danella PentonMark F. Laytoya Ion, MD, <Dictator> Cartez Mogle Sherlene ShamsF Teagen Bucio MD ELECTRONICALLY SIGNED 05/11/2011 17:57

## 2014-05-04 NOTE — Consult Note (Signed)
Colonoscopy showed friable mucosa with multiple AVM's in ascending colon area. Area cauterized with argon plasma coagulation. Reg diet ordered. Pt at risk for bleeding from same area in the future. Recommend avoiding ASA or NSAIDS if possible. Hopefully, patient can be discharge by tomorrow unless patient has gross active bleeding. THanks  Electronic Signatures: Gennell How (MLutricia Feil)  (Signed on 28-Jan-13 16:37)  Authored  Last Updated: 28-Jan-13 16:37 by Lutricia Feilh, Ramzey Petrovic (MD)

## 2014-05-04 NOTE — Consult Note (Signed)
Brief Consult Note: Diagnosis: anemia, lower GI bleed.   Patient was seen by consultant.   Consult note dictated.   Discussed with Attending MD.   Comments: Appreciate consult: saw this patient today in clinic, please refer to consult note.  Impression and plan:Lower GI bleeding likely to AVM.  Agree with transfusion, may require additional colonoscopy when clinically feasible for control of bleeding. Additionally patient with dysphagia and weight loss: will address once bleeding addressed. We are happy to follow.  Electronic Signatures: Vevelyn PatLondon, Ezrie Bunyan H (NP)  (Signed 29-Apr-13 18:13)  Authored: Brief Consult Note   Last Updated: 29-Apr-13 18:13 by Keturah BarreLondon, Analysa Nutting H (NP)

## 2014-05-04 NOTE — Discharge Summary (Signed)
PATIENT NAME:  [name] [name], Kealohilani B MR#:  [id number] DATE OF BIRTH:  10-15-1918  DATE OF ADMISSION:  05/09/2011 DATE OF DISCHARGE:  05/13/2011  DISCHARGE DIAGNOSES:  1. Lower gastrointestinal bleed secondary to colonic angiodysplasias.  2. Symptomatic anemia.  3. Cardiomyopathy secondary to atrial stenosis, tricuspid regurgitation, and pulmonary hypertension.  4. Chronic atrial fibrillation.   DISCHARGE MEDICATIONS:  1. Advair 250/50 one puff twice a day. 2. Magnesium oxide 400 mg daily. 3. Metoprolol tartrate 25 mg daily.  4. Torsemide 20 mg 1-1/2 tablets daily.  5. Vitamin D daily.  6. Cetirizine 10 mg at bedtime.  7. Potassium chloride 10 milliequivalents three times daily. 8. Norco 5/325 mg as needed for pain.  9. Ferrous sulfate 325 mg twice a day.  REASON FOR ADMISSION: This is a 81 year old female who presents with hemoglobin of 7.5 and persistent lower GI bleed with shortness of breath. Please see history and physical for history of present illness, past medical history, and physical examination.  HOSPITAL COURSE: The patient was admitted, transfused 2 units PRBCs. Hemoglobin went from 7.5 up to 9 and stable at 8.8 on discharge. Upper endoscopy normal. Colonoscopy showed friable mucosa, but no clear area of bleeding. Her hemoglobin was stable. She had no more bleeding. She will be discharged on her usual medicines. Her cardiomyopathy and congestive       heart failure were stable throughout the hospital course. Her overall prognosis is guarded with multiple medical problems. She will followup with Dr. Hyacinth MeekerMiller next week.  ____________________________ Danella PentonMark F. Ciana Simmon, MD mfm:slb D: 05/13/2011 07:25:58 ET T: 05/13/2011 11:31:25 ET JOB#: [id number]  cc: Danella PentonMark F. Shuna Tabor, MD, <Dictator> Lisvet Rasheed Sherlene ShamsF Kamsiyochukwu Buist MD ELECTRONICALLY SIGNED 05/15/2011 10:47

## 2014-05-04 NOTE — Consult Note (Signed)
Brief Consult Note: Diagnosis: 81 yr old female with multiple medical problems,came in for back painrefractry to pain meds scheduled for T11 kyphoplasty.denies any chest pain,no malena.   Patient was seen by consultant.   Consult note dictated.   Recommend further assessment or treatment.   Orders entered.   Discussed with Attending MD.   Comments: 81 yr old female with low back pain with T11 compression fracture is at moderate risk for palnned surgery due to advanced age,multiple comorbisites if htn,anemia,copd. 1.now has  acute on chronic anemia:recent Colonoscopy with angiodysplasia and cauterization,transfuse one unit,check stool for guaic,serial Hb,recommend holding surgery till anemia is corrected 2.hypotension:likely related to meds:will hold her Bp meds,Iv fluids and check BP,no evidence of infection 3.osteoporosis 4. .hypokalemia:replace chronic obstructive pulmonary disease;not in exacerbation.  Electronic Signatures: Katha HammingKonidena, Wilma Wuthrich (MD)  (Signed [phone]-Feb-13 11:11)  Authored: Brief Consult Note   Last Updated: 26-Feb-13 11:11 by Katha HammingKonidena, Elwin Tsou (MD)

## 2014-05-04 NOTE — Consult Note (Signed)
Colonoscopy showed mild diffuse melanosis coli. Mucosa on right friable with tiny petechiae but no active bleeding at all. Reg diet ordered. Blood transfusion as needed. Repeat video capsule study only if patient actively bleeds. Will sign off. THanks.  Electronic Signatures: Lutricia Feilh, Sarahbeth Cashin (MD)  (Signed on 02-May-13 14:52)  Authored  Last Updated: 02-May-13 14:52 by Lutricia Feilh, Florean Hoobler (MD)

## 2014-05-04 NOTE — H&P (Signed)
PATIENT NAME:  [name] [name], Keshona B MR#:  [id number] DATE OF BIRTH:  1918-09-09  DATE OF ADMISSION:  02/04/2011  HISTORY OF PRESENT ILLNESS:  81 year old female who presents with gastrointestinal bleed, acute. She was found to have a hemoglobin of 7.5 approximately two weeks ago and was transfused with 2 units PRBC [facility], subsequently developed significant congestive heart failure, worse than her baseline and her medicines were increased. She went to gastroenterology for work-up of this anemia. She apparently had gastrointestinal bleeding dist[facility] maybe [facility] to 20 years ago. This morning while talking to the nurse practitioner, she reported bright red blood per rectum, maroon stool and was heme positive on rectal. She notes dyspnea on exertion. No chest pain. She is not really responding well to p.o. diuretic. She will be admitted for further evaluation and treatment with her acute gastrointestinal bleed.   PAST MEDICAL HISTORY/MEDICAL ILLNESSES:  1. Osteoporosis.  2. History of subarachnoid hemorrhage. 3. Chronic atrial fibrillation. 4. Congestive heart failure secondary to AS and pulmonary hypertension.   PAST SURGICAL HISTORY:  1. Hysterectomy.  2. Right total hip. 3. Mitral valve replacement 1976, 1990 and 2004.   ALLERGIES: Codeine, Fosamax, sulfa.   MEDICATIONS:  1. Norco 5/325, 1/2 to 1 b.i.d. p.r.n.  2. Advair 250/50 b.i.d.  3. Ventolin inhaler 2 puffs every four hours p.r.n.  4. MiraLAX 17 grams daily.  5. Magnesium 400 mg daily.  6. Protonix 40 mg b.i.d.  7. Iron tab b.i.d.  8. Torsemide 20 mg 1-1/2 daily.  9. Metolazone 5 mg every other day.  10. Metoprolol tartrate 25 mg daily.  11. Potassium chloride 10 mEq 4 tabs daily.   SOCIAL HISTORY: She is married. No smoking or alcohol.   FAMILY HISTORY: Noncontributory.   REVIEW OF SYSTEMS: As above, otherwise negative.   PHYSICAL EXAMINATION:  VITAL SIGNS: Blood pressure 100/50,  pulse 60, regular, weight 121.   NECK: 3+ jugular venous distention.   LUNGS: Bibasilar crackles.   HEART: Regular rhythm, 3/6 systolic murmur throughout the precordium.   ABDOMEN: Edema in the lower abdomen. Soft, good bowel sounds.   EXTREMITIES: 4+ edema to lower abdomen.   ASSESSMENT AND PLAN:  1. Gastrointestinal bleed. Gastroenterology evaluation in progress. Blood work pending, may need more transfusions. 2. Congestive heart failure, needs aggressive treatment with IV Lasix and potassium repletion.  3. Atrial fibrillation, rate controlled, not an anticoagulant candidate, clearly.  4. Valvular disease, has really resulted in her heart failure. She has been reasonably managed in the last six months although currently totally decompensated. We will add oxygen. Hopefully, will IV diurese nicely.   OVERALL PROGNOSIS: Guarded.   ____________________________ Danella PentonMark F. Ramonia Mcclaran, MD mfm:ap D: 02/04/2011 13:03:57 ET             T: 02/04/2011 13:17:02 ET                  JOB#: [id number] cc: Danella PentonMark F. Trenia Tennyson, MD, <Dictator> Paulla Mcclaskey Sherlene ShamsF Rourke Mcquitty MD ELECTRONICALLY SIGNED 02/11/2011 8:11
